# Patient Record
Sex: Female | Born: 1985 | Race: Black or African American | Hispanic: No | Marital: Single | Smoking: Former smoker
Health system: Southern US, Community
[De-identification: ages and names within clinical notes are randomized; demographics above are authoritative.]

## PROBLEM LIST (undated history)

## (undated) ENCOUNTER — Inpatient Hospital Stay (HOSPITAL_COMMUNITY): Payer: Self-pay

## (undated) DIAGNOSIS — K589 Irritable bowel syndrome without diarrhea: Secondary | ICD-10-CM

## (undated) DIAGNOSIS — F319 Bipolar disorder, unspecified: Secondary | ICD-10-CM

## (undated) DIAGNOSIS — S02402A Zygomatic fracture, unspecified, initial encounter for closed fracture: Secondary | ICD-10-CM

## (undated) DIAGNOSIS — G2581 Restless legs syndrome: Secondary | ICD-10-CM

## (undated) DIAGNOSIS — F32A Depression, unspecified: Secondary | ICD-10-CM

## (undated) DIAGNOSIS — F419 Anxiety disorder, unspecified: Secondary | ICD-10-CM

## (undated) DIAGNOSIS — E119 Type 2 diabetes mellitus without complications: Secondary | ICD-10-CM

## (undated) DIAGNOSIS — D219 Benign neoplasm of connective and other soft tissue, unspecified: Secondary | ICD-10-CM

## (undated) DIAGNOSIS — F329 Major depressive disorder, single episode, unspecified: Secondary | ICD-10-CM

## (undated) DIAGNOSIS — O009 Unspecified ectopic pregnancy without intrauterine pregnancy: Secondary | ICD-10-CM

## (undated) HISTORY — PX: WISDOM TOOTH EXTRACTION: SHX21

## (undated) HISTORY — DX: Restless legs syndrome: G25.81

## (undated) HISTORY — DX: Zygomatic fracture, unspecified side, initial encounter for closed fracture: S02.402A

## (undated) HISTORY — DX: Benign neoplasm of connective and other soft tissue, unspecified: D21.9

---
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
## 2005-01-01 ENCOUNTER — Emergency Department (HOSPITAL_COMMUNITY): Admission: EM | Admit: 2005-01-01 | Discharge: 2005-01-01 | Payer: Self-pay | Admitting: Emergency Medicine

## 2005-02-05 ENCOUNTER — Other Ambulatory Visit: Admission: RE | Admit: 2005-02-05 | Discharge: 2005-02-05 | Payer: Self-pay | Admitting: Obstetrics and Gynecology

## 2005-09-14 ENCOUNTER — Emergency Department (HOSPITAL_COMMUNITY): Admission: EM | Admit: 2005-09-14 | Discharge: 2005-09-14 | Payer: Self-pay | Admitting: Family Medicine

## 2006-05-03 ENCOUNTER — Emergency Department (HOSPITAL_COMMUNITY): Admission: EM | Admit: 2006-05-03 | Discharge: 2006-05-03 | Payer: Self-pay | Admitting: Emergency Medicine

## 2006-12-23 ENCOUNTER — Emergency Department (HOSPITAL_COMMUNITY): Admission: EM | Admit: 2006-12-23 | Discharge: 2006-12-23 | Payer: Self-pay | Admitting: Emergency Medicine

## 2007-03-04 DIAGNOSIS — K589 Irritable bowel syndrome without diarrhea: Secondary | ICD-10-CM

## 2007-03-04 HISTORY — DX: Irritable bowel syndrome, unspecified: K58.9

## 2007-04-09 ENCOUNTER — Emergency Department (HOSPITAL_COMMUNITY): Admission: EM | Admit: 2007-04-09 | Discharge: 2007-04-09 | Payer: Self-pay | Admitting: Emergency Medicine

## 2007-07-16 ENCOUNTER — Emergency Department (HOSPITAL_COMMUNITY): Admission: EM | Admit: 2007-07-16 | Discharge: 2007-07-16 | Payer: Self-pay | Admitting: Emergency Medicine

## 2007-07-20 ENCOUNTER — Emergency Department (HOSPITAL_COMMUNITY): Admission: EM | Admit: 2007-07-20 | Discharge: 2007-07-20 | Payer: Self-pay | Admitting: Emergency Medicine

## 2008-01-10 ENCOUNTER — Inpatient Hospital Stay (HOSPITAL_COMMUNITY): Admission: AD | Admit: 2008-01-10 | Discharge: 2008-01-10 | Payer: Self-pay | Admitting: Obstetrics & Gynecology

## 2008-05-01 ENCOUNTER — Emergency Department (HOSPITAL_COMMUNITY): Admission: EM | Admit: 2008-05-01 | Discharge: 2008-05-01 | Payer: Self-pay | Admitting: Emergency Medicine

## 2008-05-17 ENCOUNTER — Ambulatory Visit: Payer: Self-pay | Admitting: Obstetrics & Gynecology

## 2008-05-17 ENCOUNTER — Encounter: Payer: Self-pay | Admitting: Family

## 2008-07-09 ENCOUNTER — Inpatient Hospital Stay (HOSPITAL_COMMUNITY): Admission: AD | Admit: 2008-07-09 | Discharge: 2008-07-09 | Payer: Self-pay | Admitting: Obstetrics & Gynecology

## 2008-07-11 ENCOUNTER — Inpatient Hospital Stay (HOSPITAL_COMMUNITY): Admission: AD | Admit: 2008-07-11 | Discharge: 2008-07-11 | Payer: Self-pay | Admitting: Family Medicine

## 2008-08-17 ENCOUNTER — Emergency Department (HOSPITAL_COMMUNITY): Admission: EM | Admit: 2008-08-17 | Discharge: 2008-08-18 | Payer: Self-pay | Admitting: Emergency Medicine

## 2008-08-31 ENCOUNTER — Inpatient Hospital Stay (HOSPITAL_COMMUNITY): Admission: AD | Admit: 2008-08-31 | Discharge: 2008-08-31 | Payer: Self-pay | Admitting: Obstetrics & Gynecology

## 2010-03-12 ENCOUNTER — Inpatient Hospital Stay (HOSPITAL_COMMUNITY)
Admission: AD | Admit: 2010-03-12 | Discharge: 2010-03-12 | Disposition: A | Discharge status: Still In Hospital | Payer: Self-pay | Source: Home / Self Care | Attending: Obstetrics and Gynecology | Admitting: Obstetrics and Gynecology

## 2010-03-14 ENCOUNTER — Inpatient Hospital Stay (HOSPITAL_COMMUNITY)
Admission: AD | Admit: 2010-03-14 | Discharge: 2010-03-14 | Payer: Self-pay | Source: Home / Self Care | Attending: Obstetrics and Gynecology | Admitting: Obstetrics and Gynecology

## 2010-03-17 ENCOUNTER — Inpatient Hospital Stay (HOSPITAL_COMMUNITY)
Admission: AD | Admit: 2010-03-17 | Discharge: 2010-03-17 | Payer: Self-pay | Source: Home / Self Care | Attending: Family Medicine | Admitting: Family Medicine

## 2010-03-18 LAB — CBC
HCT: 37.4 % (ref 36.0–46.0)
HCT: 34.9 % — ABNORMAL LOW (ref 36.0–46.0)
Hemoglobin: 12.6 g/dL (ref 12.0–15.0)
Hemoglobin: 11.8 g/dL — ABNORMAL LOW (ref 12.0–15.0)
MCH: 27.8 pg (ref 26.0–34.0)
MCH: 27.9 pg (ref 26.0–34.0)
MCHC: 33.7 g/dL (ref 30.0–36.0)
MCHC: 33.8 g/dL (ref 30.0–36.0)
MCV: 82.4 fL (ref 78.0–100.0)
MCV: 82.5 fL (ref 78.0–100.0)
Platelets: 301 10*3/uL (ref 150–400)
Platelets: 272 10*3/uL (ref 150–400)
RBC: 4.54 MIL/uL (ref 3.87–5.11)
RBC: 4.23 MIL/uL (ref 3.87–5.11)
RDW: 13.8 % (ref 11.5–15.5)
RDW: 13.9 % (ref 11.5–15.5)
WBC: 6.2 10*3/uL (ref 4.0–10.5)
WBC: 4.7 10*3/uL (ref 4.0–10.5)

## 2010-03-18 LAB — URINALYSIS, ROUTINE W REFLEX MICROSCOPIC
Bilirubin Urine: NEGATIVE
Bilirubin Urine: NEGATIVE
Hgb urine dipstick: NEGATIVE
Hgb urine dipstick: NEGATIVE
Ketones, ur: NEGATIVE mg/dL
Ketones, ur: NEGATIVE mg/dL
Nitrite: NEGATIVE
Nitrite: NEGATIVE
Protein, ur: NEGATIVE mg/dL
Protein, ur: NEGATIVE mg/dL
Specific Gravity, Urine: 1.02 (ref 1.005–1.030)
Specific Gravity, Urine: 1.02 (ref 1.005–1.030)
Urine Glucose, Fasting: NEGATIVE mg/dL
Urine Glucose, Fasting: NEGATIVE mg/dL
Urobilinogen, UA: 0.2 mg/dL (ref 0.0–1.0)
Urobilinogen, UA: 0.2 mg/dL (ref 0.0–1.0)
pH: 7 (ref 5.0–8.0)
pH: 6.5 (ref 5.0–8.0)

## 2010-03-18 LAB — GC/CHLAMYDIA PROBE AMP, GENITAL
Chlamydia, DNA Probe: NEGATIVE
GC Probe Amp, Genital: NEGATIVE

## 2010-03-18 LAB — HCG, QUANTITATIVE, PREGNANCY
hCG, Beta Chain, Quant, S: 174 m[IU]/mL — ABNORMAL HIGH (ref <5)
hCG, Beta Chain, Quant, S: 601 m[IU]/mL — ABNORMAL HIGH (ref <5)
hCG, Beta Chain, Quant, S: 2042 m[IU]/mL — ABNORMAL HIGH (ref <5)

## 2010-03-18 LAB — WET PREP, GENITAL
Clue Cells Wet Prep HPF POC: NONE SEEN
Trich, Wet Prep: NONE SEEN
Yeast Wet Prep HPF POC: NONE SEEN

## 2010-03-18 LAB — ABO/RH
ABO/RH(D): O NEG
ABO/RH(D): O NEG

## 2010-03-18 LAB — POCT PREGNANCY, URINE: Preg Test, Ur: POSITIVE

## 2010-03-21 ENCOUNTER — Ambulatory Visit (HOSPITAL_COMMUNITY)
Admission: RE | Admit: 2010-03-21 | Discharge: 2010-03-21 | Payer: Self-pay | Source: Home / Self Care | Attending: Obstetrics & Gynecology | Admitting: Obstetrics & Gynecology

## 2010-04-03 ENCOUNTER — Encounter: Payer: Self-pay | Admitting: Obstetrics and Gynecology

## 2010-04-11 ENCOUNTER — Inpatient Hospital Stay (HOSPITAL_COMMUNITY): Payer: Self-pay

## 2010-04-11 ENCOUNTER — Inpatient Hospital Stay (HOSPITAL_COMMUNITY)
Admission: AD | Admit: 2010-04-11 | Discharge: 2010-04-11 | Disposition: A | Discharge status: Home / Self Care | Payer: Self-pay | Source: Ambulatory Visit | Attending: Obstetrics & Gynecology | Admitting: Obstetrics & Gynecology

## 2010-04-11 DIAGNOSIS — IMO0002 Reserved for concepts with insufficient information to code with codable children: Secondary | ICD-10-CM

## 2010-04-11 DIAGNOSIS — O021 Missed abortion: Secondary | ICD-10-CM | POA: Insufficient documentation

## 2010-04-11 LAB — URINE MICROSCOPIC-ADD ON

## 2010-04-11 LAB — URINALYSIS, ROUTINE W REFLEX MICROSCOPIC
Bilirubin Urine: NEGATIVE
Nitrite: NEGATIVE
Specific Gravity, Urine: 1.02 (ref 1.005–1.030)
Urobilinogen, UA: 1 mg/dL (ref 0.0–1.0)
pH: 6.5 (ref 5.0–8.0)

## 2010-04-11 LAB — CBC
MCH: 27.7 pg (ref 26.0–34.0)
Platelets: 304 10*3/uL (ref 150–400)
RBC: 4.3 MIL/uL (ref 3.87–5.11)
RDW: 13.7 % (ref 11.5–15.5)
WBC: 4.2 10*3/uL (ref 4.0–10.5)

## 2010-04-11 LAB — HCG, QUANTITATIVE, PREGNANCY: hCG, Beta Chain, Quant, S: 47780 m[IU]/mL — ABNORMAL HIGH (ref <5)

## 2010-04-12 LAB — URINE CULTURE

## 2010-04-12 LAB — RH IMMUNE GLOBULIN WORKUP (NOT WOMEN'S HOSP): ABO/RH(D): O NEG

## 2010-04-25 ENCOUNTER — Encounter: Payer: Self-pay | Admitting: Physician Assistant

## 2010-06-09 LAB — GC/CHLAMYDIA PROBE AMP, GENITAL
Chlamydia, DNA Probe: NEGATIVE
GC Probe Amp, Genital: NEGATIVE

## 2010-06-09 LAB — CBC
HCT: 37.2 % (ref 36.0–46.0)
Hemoglobin: 12.8 g/dL (ref 12.0–15.0)
MCHC: 34.3 g/dL (ref 30.0–36.0)
MCV: 86.8 fL (ref 78.0–100.0)
Platelets: 257 10*3/uL (ref 150–400)
RDW: 13.3 % (ref 11.5–15.5)

## 2010-06-09 LAB — WET PREP, GENITAL
Trich, Wet Prep: NONE SEEN
Yeast Wet Prep HPF POC: NONE SEEN

## 2010-06-09 LAB — POCT PREGNANCY, URINE: Preg Test, Ur: NEGATIVE

## 2010-06-10 LAB — URINALYSIS, ROUTINE W REFLEX MICROSCOPIC
Bilirubin Urine: NEGATIVE
Nitrite: NEGATIVE
Specific Gravity, Urine: 1.026 (ref 1.005–1.030)
Urobilinogen, UA: 0.2 mg/dL (ref 0.0–1.0)

## 2010-06-10 LAB — GC/CHLAMYDIA PROBE AMP, GENITAL
Chlamydia, DNA Probe: NEGATIVE
GC Probe Amp, Genital: NEGATIVE

## 2010-06-10 LAB — WET PREP, GENITAL
Trich, Wet Prep: NONE SEEN
Yeast Wet Prep HPF POC: NONE SEEN

## 2010-06-10 LAB — URINE MICROSCOPIC-ADD ON

## 2010-06-10 LAB — POCT PREGNANCY, URINE: Preg Test, Ur: NEGATIVE

## 2010-06-11 LAB — CBC
HCT: 35.5 % — ABNORMAL LOW (ref 36.0–46.0)
MCHC: 34.3 g/dL (ref 30.0–36.0)
MCV: 87.4 fL (ref 78.0–100.0)
RBC: 4.06 MIL/uL (ref 3.87–5.11)
WBC: 4.3 10*3/uL (ref 4.0–10.5)

## 2010-06-11 LAB — WET PREP, GENITAL: Clue Cells Wet Prep HPF POC: NONE SEEN

## 2010-06-13 LAB — WET PREP, GENITAL
Trich, Wet Prep: NONE SEEN
Yeast Wet Prep HPF POC: NONE SEEN

## 2010-06-13 LAB — POCT URINALYSIS DIP (DEVICE)
Bilirubin Urine: NEGATIVE
Glucose, UA: NEGATIVE mg/dL
Ketones, ur: NEGATIVE mg/dL
Specific Gravity, Urine: 1.02 (ref 1.005–1.030)

## 2010-07-16 NOTE — Group Therapy Note (Signed)
Brandi Lucero, Brandi Lucero                 ACCOUNT NO.:  [id number]   MEDICAL RECORD NO.:  [id number]          PATIENT TYPE:  WOC   LOCATION:  [facility]                   FACILITY:  [facility]   PHYSICIAN:  Sid Falcon, CNM  DATE OF BIRTH:  Oct 18, 1985   DATE OF SERVICE:  05/17/2008                                  CLINIC NOTE   The patient is here for a well-woman exam.  Seen at [facility] on March 1, diagnosed with bacterial vaginosis and treated with  Flagyl.  The patient reports that symptoms have resolved and is here  because she has not had a Pap smear.  The patient denies any other  changes in her medical history and denies any changes in her family  history.   SOCIAL HISTORY:  The patient is in a relationship.  Boyfriend has been  in [place] times one, involved in a monogamous relationship.  Does not  desire birth control method at this time.   PHYSICAL EXAMINATION:  GENERAL:  The patient is alert and oriented x3.  No signs of acute distress.  NECK:  No thyromegaly.  No dominant masses.  Nontender to  palpation.  CHEST:  Breasts soft.  Bilateral nipple rings.  No dominant masses.  No  nipple discharge.  No retractions bilaterally.  CARDIOVASCULAR:  Regular rate and rhythm without murmurs, gallops or  rubs.  LUNGS:  Clear to auscultation bilaterally.  ABDOMEN:  Positive bowel sounds x4.  No hepatosplenomegaly.  PELVIC:  Vagina:  No redness, no abnormal discharge, no abnormal  lesions.  Cervix:  No abnormal discharge.  Negative cervical motion  tenderness.  Uterus:  Midline, mobile, nontender with palpation.  No  dominant masses.  Adnexa nontender with palpation.  No dominant masses.  EXTREMITIES:  Without edema, 2+ bilateral patellar DTRs.   ASSESSMENT:  Well-woman exam.   PLAN:  Pap smear sent to lab.  Advised condom use.  Reviewed the reason  for a Pap smear.  The patient will follow up as needed.      Sid Falcon, CNM     WM/MEDQ  D:  05/17/2008  T:   05/18/2008  Job:  [id number]

## 2010-07-19 NOTE — Group Therapy Note (Signed)
NAMELIANAH, PEED NO.:  [id number]   MEDICAL RECORD NO.:  [id number]          PATIENT TYPE:  WOC   LOCATION:  [facility]                   FACILITY:  [facility]   PHYSICIAN:  Elsie Lincoln, MD      DATE OF BIRTH:  09/18/1985   DATE OF SERVICE:  06/25/2004                                    CLINIC NOTE   The patient is an 25 year old female who underwent laparoscopic RSO for a  leaking ectopic on May 30, 2004.  The patient had an uncomplicated  laparoscopy and recovery.  The patient has no complaints since the surgery.  I inquired about whether she wanted birth control at this time and she does.  However, she states that she is a patient of [facility] and has an  appointment there next week.  She wants to followup with them.  I also note  that patient was a smoker and encouraged her to quit, the patient verbalizes  that she knows it is bad for her.   ASSESSMENT AND PLAN:  An 25 year old female well healed after laparoscopic  RSO for ruptured ectopic.  Return to [facility] for Pap smears and  starting of oral contraceptives.      KL/MEDQ  D:  06/25/2004  T:  06/25/2004  Job:  [id number]

## 2010-07-19 NOTE — Op Note (Signed)
Brandi Lucero, Brandi Lucero                 ACCOUNT NO.:  [id number]   MEDICAL RECORD NO.:  [id number]          PATIENT TYPE:  AMB   LOCATION:  910B                          [facility]   PHYSICIAN:  Lesly Dukes, M.D. DATE OF BIRTH:  1985/04/16   DATE OF PROCEDURE:  05/30/2004  DATE OF DISCHARGE:  05/30/2004                                 OPERATIVE REPORT   PREOPERATIVE DIAGNOSIS:  The patient is an 25 year old female with leaking  right ectopic.   POSTOPERATIVE DIAGNOSIS:  The patient is an 25 year old female with leaking  right ectopic.   PROCEDURE:  Diagnostic laparoscopy and right partial salpingectomy.   SURGEON:  Lesly Dukes, M.D.   ASSISTANT:  Shelbie Proctor. Shawnie Pons, M.D.   ESTIMATED BLOOD LOSS:  Minimal.   ANESTHESIA:  General.   COMPLICATIONS:  None.   PATHOLOGY:  Right fallopian tube and ectopic pregnancy.   FINDINGS:  Markedly dilated right fallopian tube that was leaking from the  ectopic pregnancy.  Perfectly normal pelvis otherwise.  Left ovary and tube  normal, right ovary normal grossly.   PROCEDURE:  After informed consent was obtained, the patient was taken to  the operating room, where general anesthesia was induced.  The patient was  prepared and draped in normal sterile fashion.  A Hulka clip was placed into  the uterus to aid in mobilization.  Gown and gloves were changed.  The  bladder was emptied by the nurse.  An infraumbilical skin incision was made  with the scalpel and carried down bluntly to the fascia.  The fascia was  incised in the midline.  This incision was extended superiorly and  inferiorly bluntly.  The peritoneum was entered bluntly and the Hasson  trocar was placed into the abdomen.  Sutures of 0 Vicryl were used to tag  the fascia and anchor the Hasson trocar.  The pneumoperitoneum was achieved  with CO2, the laparoscope __________ the uterus and the above findings were  found.  Two pelvic ports were placed under direct visualization.   There was  a 10 mm on the right and a 5 mm on the left.  In each case, the skin was  incised with a scalpel and a trocar was introduced under direct  visualization.  The tube was elevated and the tripolar was used to excise a  portion of the tube and the ectopic pregnancy.  An Endopouch was introduced  into the abdomen and the ectopic was removed without complications.  The  area was irrigated and found to be hemostatic.  Pneumoperitoneum was  released and the laparoscope and Hasson trocar were brought out together.  The fascia at the umbilicus was closed with 0 Vicryl and the right 10 mm  port fascial incision was closed with 0 Vicryl.  The skin was closed with 4-  0 Vicryl in subcuticular fashion in each  port.  The Hulka clip was removed from the cervix and the cervix was noted  to be hemostatic.  The patient tolerated the procedure well.  Sponge, lap,  instrument and needle count were correct x2.  The patient went to the  recovery room in stable condition.      KHL/MEDQ  D:  07/11/2004  T:  07/11/2004  Job:  [id number]

## 2010-11-27 LAB — RAPID STREP SCREEN (MED CTR MEBANE ONLY): Streptococcus, Group A Screen (Direct): NEGATIVE

## 2010-12-03 LAB — WET PREP, GENITAL

## 2010-12-11 LAB — URINALYSIS, ROUTINE W REFLEX MICROSCOPIC
Glucose, UA: NEGATIVE
Protein, ur: NEGATIVE
Specific Gravity, Urine: 1.023

## 2010-12-11 LAB — WET PREP, GENITAL
Clue Cells Wet Prep HPF POC: NONE SEEN
Trich, Wet Prep: NONE SEEN
Yeast Wet Prep HPF POC: NONE SEEN

## 2010-12-11 LAB — PREGNANCY, URINE: Preg Test, Ur: NEGATIVE

## 2011-05-30 ENCOUNTER — Encounter (HOSPITAL_COMMUNITY): Payer: Self-pay

## 2011-05-30 ENCOUNTER — Emergency Department (INDEPENDENT_AMBULATORY_CARE_PROVIDER_SITE_OTHER)
Admission: EM | Admit: 2011-05-30 | Discharge: 2011-05-30 | Disposition: A | Discharge status: Home / Self Care | Payer: Self-pay | Source: Home / Self Care | Attending: Emergency Medicine | Admitting: Emergency Medicine

## 2011-05-30 DIAGNOSIS — K529 Noninfective gastroenteritis and colitis, unspecified: Secondary | ICD-10-CM

## 2011-05-30 DIAGNOSIS — K5289 Other specified noninfective gastroenteritis and colitis: Secondary | ICD-10-CM

## 2011-05-30 DIAGNOSIS — R1110 Vomiting, unspecified: Secondary | ICD-10-CM

## 2011-05-30 HISTORY — DX: Irritable bowel syndrome without diarrhea: K58.9

## 2011-05-30 LAB — POCT I-STAT, CHEM 8
BUN: 15 mg/dL (ref 6–23)
Creatinine, Ser: 0.8 mg/dL (ref 0.50–1.10)
Potassium: 3.8 mEq/L (ref 3.5–5.1)
Sodium: 138 mEq/L (ref 135–145)
TCO2: 22 mmol/L (ref 0–100)

## 2011-05-30 MED ORDER — DIPHENOXYLATE-ATROPINE 2.5-0.025 MG PO TABS: 1.0000 | ORAL_TABLET | Freq: Four times a day (QID) | ORAL | Status: DC | PRN

## 2011-05-30 MED ORDER — ONDANSETRON 4 MG PO TBDP
8.0000 mg | ORAL_TABLET | Freq: Once | ORAL | Status: AC
  Administered 2011-05-30: 8 mg via ORAL

## 2011-05-30 MED ORDER — ONDANSETRON HCL 4 MG PO TABS: 4.0000 mg | ORAL_TABLET | Freq: Three times a day (TID) | ORAL | Status: DC | PRN

## 2011-05-30 MED ORDER — ONDANSETRON 4 MG PO TBDP
ORAL_TABLET | ORAL | Status: AC
  Filled 2011-05-30: qty 2

## 2011-05-30 NOTE — ED Provider Notes (Signed)
History     CSN: [id number]  Arrival date & time 05/30/11  5621   First MD Initiated Contact with Patient 05/30/11 1109      Chief Complaint  Patient presents with  . Abdominal Pain    (Consider location/radiation/quality/duration/timing/severity/associated sxs/prior treatment) HPI Comments: Patient presents to urgent care, abdominal cramps and ongoing vomiting since this morning with 10-12 episodes of diarrhea. Been feeling tired fatigued and dizzy this morning try to take 2 tablets of Pepto-Bismol have not helped her.  She could have had a fever she felt warm and was sweating. No respiratory symptoms  Patient is a 26 y.o. female presenting with abdominal pain. The history is provided by the patient.  Abdominal Pain The primary symptoms of the illness include abdominal pain, fatigue, nausea, vomiting and diarrhea. The primary symptoms of the illness do not include fever. The current episode started 6 to 12 hours ago. The onset of the illness was sudden. The problem has not changed since onset. Additional symptoms associated with the illness include chills. Symptoms associated with the illness do not include constipation, urgency or frequency.    Past Medical History  Diagnosis Date  . IBS (irritable bowel syndrome) 2009    History reviewed. No pertinent past surgical history.  History reviewed. No pertinent family history.  History  Substance Use Topics  . Smoking status: Current Everyday Smoker -- 0.5 packs/day    Types: Cigarettes  . Smokeless tobacco: Not on file  . Alcohol Use: Yes     occasional    OB History    Grav Para Term Preterm Abortions TAB SAB Ect Mult Living                  Review of Systems  Constitutional: Positive for chills, activity change and fatigue. Negative for fever.  HENT: Negative for facial swelling.   Eyes: Negative for redness.  Cardiovascular: Negative for chest pain.  Gastrointestinal: Positive for nausea, vomiting, abdominal  pain and diarrhea. Negative for constipation.  Genitourinary: Negative for urgency and frequency.    Allergies  Review of patient's allergies indicates no known allergies.  Home Medications   Current Outpatient Rx  Name Route Sig Dispense Refill  . DIPHENOXYLATE-ATROPINE 2.5-0.025 MG PO TABS Oral Take 1 tablet by mouth 4 (four) times daily as needed for diarrhea or loose stools. 30 tablet 0  . ONDANSETRON HCL 4 MG PO TABS Oral Take 1 tablet (4 mg total) by mouth every 8 (eight) hours as needed for nausea. 20 tablet 0    BP 124/76  Pulse 110  Temp(Src) 97 F (36.1 C) (Oral)  Resp 18  Ht 5\' 1"  (1.549 m)  Wt 135 lb (61.236 kg)  BMI 25.51 kg/m2  SpO2 97%  LMP 05/20/2011  Physical Exam  HENT:  Head: Normocephalic.  Eyes: Conjunctivae are normal. No scleral icterus.  Neck: Neck supple.  Pulmonary/Chest: No respiratory distress.  Abdominal: Soft. She exhibits no mass. There is no hepatosplenomegaly, splenomegaly or hepatomegaly. There is tenderness in the epigastric area and periumbilical area. There is no rigidity, no rebound, no guarding, no CVA tenderness, no tenderness at McBurney's point and negative Murphy's sign. No hernia.  Skin: No rash noted. She is not diaphoretic.    ED Course  Procedures (including critical care time)  Labs Reviewed  POCT I-STAT, CHEM 8 - Abnormal; Notable for the following:    Glucose, Bld 117 (*)    Calcium, Ion 1.09 (*)    All other components within normal limits  No results found.   1. Gastroenteritis   2. Vomiting       MDM  Gastroenteritis, tolerating oral fluids at [facility] no focal abdominal pain-        Jimmie Molly, MD 05/30/11 2043

## 2011-05-30 NOTE — ED Notes (Signed)
Pt in clinic today c/o stomach ache, diarrhea and vomiting that started early this morning. Denies any blood in diarrhea or vomit. Also c/o of dizziness and weakness. Treated symptoms with pepto bismol 2 tablets with no relief.

## 2011-05-30 NOTE — Discharge Instructions (Signed)
S. discuss your current exam and symptoms were consistent with a viral gastroenteritis use his prescribed medicines and B. precautions about your diet in the next 48 hours C. further information below on your discharge instructions for further symptom management. If unable to tolerate fluids the next 8-12 hours should go to the emergency department for further management and treatment.    Diet for Diarrhea, Adult Having frequent, runny stools (diarrhea) has many causes. Diarrhea may be caused or worsened by food or drink. Diarrhea may be relieved by changing your diet. IF YOU ARE NOT TOLERATING SOLID FOODS:  Drink enough water and fluids to keep your urine clear or pale yellow.   Avoid sugary drinks and sodas as well as milk-based beverages.   Avoid beverages containing caffeine and alcohol.   You may try rehydrating beverages. You can make your own by following this recipe:    tsp table salt.    tsp baking soda.   ? tsp salt substitute (potassium chloride).   1 tbs + 1 tsp sugar.   1 qt water.  As your stools become more solid, you can start eating solid foods. Add foods one at a time. If a certain food causes your diarrhea to get worse, avoid that food and try other foods. A low fiber, low-fat, and lactose-free diet is recommended. Small, frequent meals may be better tolerated.  Starches  Allowed:  White, [place], and pita breads, plain rolls, buns, bagels. Plain muffins, matzo. Soda, saltine, or graham crackers. Pretzels, melba toast, zwieback. Cooked cereals made with water: cornmeal, farina, cream cereals. Dry cereals: refined corn, wheat, rice. Potatoes prepared any way without skins, refined macaroni, spaghetti, noodles, refined rice.   Avoid:  Bread, rolls, or crackers made with whole wheat, multi-grains, rye, bran seeds, nuts, or coconut. Corn tortillas or taco shells. Cereals containing whole grains, multi-grains, bran, coconut, nuts, or raisins. Cooked or dry oatmeal. Coarse  wheat cereals, granola. Cereals advertised as "high-fiber." Potato skins. Whole grain pasta, wild or brown rice. Popcorn. Sweet potatoes/yams. Sweet rolls, doughnuts, waffles, pancakes, sweet breads.  Vegetables  Allowed: Strained tomato and vegetable juices. Most well-cooked and canned vegetables without seeds. Fresh: Tender lettuce, cucumber without the skin, cabbage, spinach, bean sprouts.   Avoid: Fresh, cooked, or canned: Artichokes, baked beans, beet greens, broccoli, Brussels sprouts, corn, kale, legumes, peas, sweet potatoes. Cooked: Green or red cabbage, spinach. Avoid large servings of any vegetables, because vegetables shrink when cooked, and they contain more fiber per serving than fresh vegetables.  Fruit  Allowed: All fruit juices except prune juice. Cooked or canned: Apricots, applesauce, cantaloupe, cherries, fruit cocktail, grapefruit, grapes, kiwi, mandarin oranges, peaches, pears, plums, watermelon. Fresh: Apples without skin, ripe banana, grapes, cantaloupe, cherries, grapefruit, peaches, oranges, plums. Keep servings limited to  cup or 1 piece.   Avoid: Fresh: Apple with skin, apricots, mango, pears, raspberries, strawberries. Prune juice, stewed or dried prunes. Dried fruits, raisins, dates. Large servings of all fresh fruits.  Meat and Meat Substitutes  Allowed: Ground or well-cooked tender beef, ham, veal, lamb, pork, or poultry. Eggs, plain cheese. Fish, oysters, shrimp, lobster, other seafoods. Liver, organ meats.   Avoid: Tough, fibrous meats with gristle. Peanut butter, smooth or chunky. Cheese, nuts, seeds, legumes, dried peas, beans, lentils.  Milk  Allowed: Yogurt, lactose-free milk, kefir, drinkable yogurt, buttermilk, soy milk.   Avoid: Milk, chocolate milk, beverages made with milk, such as milk shakes.  Soups  Allowed: Bouillon, broth, or soups made from allowed foods. Any strained soup.  Avoid: Soups made from vegetables that are not allowed, cream or  milk-based soups.  Desserts and Sweets  Allowed: Sugar-free gelatin, sugar-free frozen ice pops made without sugar alcohol.   Avoid: Plain cakes and cookies, pie made with allowed fruit, pudding, custard, cream pie. Gelatin, fruit, ice, sherbet, frozen ice pops. Ice cream, ice milk without nuts. Plain hard candy, honey, jelly, molasses, syrup, sugar, chocolate syrup, gumdrops, marshmallows.  Fats and Oils  Allowed: Avoid any fats and oils.   Avoid: Seeds, nuts, olives, avocados. Margarine, butter, cream, mayonnaise, salad oils, plain salad dressings made from allowed foods. Plain gravy, crisp bacon without rind.  Beverages  Allowed: Water, decaffeinated teas, oral rehydration solutions, sugar-free beverages.   Avoid: Fruit juices, caffeinated beverages (coffee, tea, soda or pop), alcohol, sports drinks, or lemon-lime soda or pop.  Condiments  Allowed: Ketchup, mustard, horseradish, vinegar, cream sauce, cheese sauce, cocoa powder. Spices in moderation: allspice, basil, bay leaves, celery powder or leaves, cinnamon, cumin powder, curry powder, ginger, mace, marjoram, onion or garlic powder, oregano, paprika, parsley flakes, ground pepper, rosemary, sage, savory, tarragon, thyme, turmeric.   Avoid: Coconut, honey.  Weight Monitoring: Weigh yourself every day. You should weigh yourself in the morning after you urinate and before you eat breakfast. Wear the same amount of clothing when you weigh yourself. Record your weight daily. Bring your recorded weights to your clinic visits. Tell your caregiver right away if you have gained 3 lb/1.4 kg or more in 1 day, 5 lb/2.3 kg in a week, or whatever amount you were told to report. SEEK IMMEDIATE MEDICAL CARE IF:   You are unable to keep fluids down.   You start to throw up (vomit) or diarrhea keeps coming back (persistent).   Abdominal pain develops, increases, or can be felt in one place (localizes).   You have an oral temperature above 102 F  (38.9 C), not controlled by medicine.   Diarrhea contains blood or mucus.   You develop excessive weakness, dizziness, fainting, or extreme thirst.  MAKE SURE YOU:   Understand these instructions.   Will watch your condition.   Will get help right away if you are not doing well or get worse.  Document Released: 05/10/2003 Document Revised: 02/06/2011 Document Reviewed: 08/31/2008 Exit[employer] Patient Information 2012 ExitCare, [place] and Vomiting Nausea is a sick feeling that often comes before throwing up (vomiting). Vomiting is a reflex where stomach contents come out of your mouth. Vomiting can cause severe loss of body fluids (dehydration). Children and elderly adults can become dehydrated quickly, especially if they also have diarrhea. Nausea and vomiting are symptoms of a condition or disease. It is important to find the cause of your symptoms. CAUSES   Direct irritation of the stomach lining. This irritation can result from increased acid production (gastroesophageal reflux disease), infection, food poisoning, taking certain medicines (such as nonsteroidal anti-inflammatory drugs), alcohol use, or tobacco use.   Signals from the brain.These signals could be caused by a headache, heat exposure, an inner ear disturbance, increased pressure in the brain from injury, infection, a tumor, or a concussion, pain, emotional stimulus, or metabolic problems.   An obstruction in the gastrointestinal tract (bowel obstruction).   Illnesses such as diabetes, hepatitis, gallbladder problems, appendicitis, kidney problems, cancer, sepsis, atypical symptoms of a heart attack, or eating disorders.   Medical treatments such as chemotherapy and radiation.   Receiving medicine that makes you sleep (general anesthetic) during surgery.  DIAGNOSIS Your caregiver may ask for tests to be  done if the problems do not improve after a few days. Tests may also be done if symptoms are severe or if the reason  for the nausea and vomiting is not clear. Tests may include:  Urine tests.   Blood tests.   Stool tests.   Cultures (to look for evidence of infection).   X-rays or other imaging studies.  Test results can help your caregiver make decisions about treatment or the need for additional tests. TREATMENT You need to stay well hydrated. Drink frequently but in small amounts.You may wish to drink water, sports drinks, clear broth, or eat frozen ice pops or gelatin dessert to help stay hydrated.When you eat, eating slowly may help prevent nausea.There are also some antinausea medicines that may help prevent nausea. HOME CARE INSTRUCTIONS   Take all medicine as directed by your caregiver.   If you do not have an appetite, do not force yourself to eat. However, you must continue to drink fluids.   If you have an appetite, eat a normal diet unless your caregiver tells you differently.   Eat a variety of complex carbohydrates (rice, wheat, potatoes, bread), lean meats, yogurt, fruits, and vegetables.   Avoid high-fat foods because they are more difficult to digest.   Drink enough water and fluids to keep your urine clear or pale yellow.   If you are dehydrated, ask your caregiver for specific rehydration instructions. Signs of dehydration may include:   Severe thirst.   Dry lips and mouth.   Dizziness.   Dark urine.   Decreasing urine frequency and amount.   Confusion.   Rapid breathing or pulse.  SEEK IMMEDIATE MEDICAL CARE IF:   You have blood or brown flecks (like coffee grounds) in your vomit.   You have black or bloody stools.   You have a severe headache or stiff neck.   You are confused.   You have severe abdominal pain.   You have chest pain or trouble breathing.   You do not urinate at least once every 8 hours.   You develop cold or clammy skin.   You continue to vomit for longer than 24 to 48 hours.   You have a fever.  MAKE SURE YOU:   Understand  these instructions.   Will watch your condition.   Will get help right away if you are not doing well or get worse.  Document Released: 02/17/2005 Document Revised: 02/06/2011 Document Reviewed: 07/17/2010 [facility] Patient Information 2012 [place], [place].Marland Kitchen

## 2011-06-04 ENCOUNTER — Encounter (HOSPITAL_COMMUNITY): Payer: Self-pay | Admitting: Emergency Medicine

## 2011-06-04 ENCOUNTER — Emergency Department (HOSPITAL_COMMUNITY)
Admission: EM | Admit: 2011-06-04 | Discharge: 2011-06-04 | Disposition: A | Discharge status: Home / Self Care | Payer: Self-pay | Attending: Emergency Medicine | Admitting: Emergency Medicine

## 2011-06-04 DIAGNOSIS — R21 Rash and other nonspecific skin eruption: Secondary | ICD-10-CM | POA: Insufficient documentation

## 2011-06-04 DIAGNOSIS — F17200 Nicotine dependence, unspecified, uncomplicated: Secondary | ICD-10-CM | POA: Insufficient documentation

## 2011-06-04 DIAGNOSIS — F411 Generalized anxiety disorder: Secondary | ICD-10-CM | POA: Insufficient documentation

## 2011-06-04 DIAGNOSIS — K589 Irritable bowel syndrome without diarrhea: Secondary | ICD-10-CM | POA: Insufficient documentation

## 2011-06-04 NOTE — ED Notes (Signed)
PT. REPORTS RASH AT ARMS , ABDOMEN AND BACK FOR 1 YEAR , DENIES ITCHING , RESPIRATIONS UNLABORED.

## 2011-06-04 NOTE — Discharge Instructions (Signed)
Anxiety and Panic Attacks Anxiety is your body's way of reacting to real danger or something you think is a danger. It may be fear or worry over a situation like losing your job. Sometimes the cause is not known. A panic attack is made up of physical signs like sweating, shaking, or chest pain. Anxiety and panic attacks may start suddenly. They may be strong. They may come at any time of day, even while sleeping. They may come at any time of life. Panic attacks are scary, but they do not harm you physically.  HOME CARE  Avoid any known causes of your anxiety.   Try to relax. Yoga may help. Tell yourself everything will be okay.   Exercise often.   Get expert advice and help (therapy) to stop anxiety or attacks from happening.   Avoid caffeine, alcohol, and drugs.   Only take medicine as told by your doctor.  GET HELP RIGHT AWAY IF:  Your attacks seem different than normal attacks.   Your problems are getting worse or concern you.  MAKE SURE YOU:  Understand these instructions.   Will watch your condition.   Will get help right away if you are not doing well or get worse.  Document Released: 03/22/2010 Document Revised: 02/06/2011 Document Reviewed: 03/22/2010 [facility] Patient Information 2012 [place], [place]. Please call and make an appointment for dermatology for further evaluation of your skin discoloration

## 2011-06-04 NOTE — ED Provider Notes (Signed)
History     CSN: [id number]  Arrival date & time 06/04/11  2107   First MD Initiated Contact with Patient 06/04/11 2152      Chief Complaint  Patient presents with  . Rash    (Consider location/radiation/quality/duration/timing/severity/associated sxs/prior treatment) HPI Comments: Patient has discolored areas on her upper right and left arm and on her low left lateral chest wall.  There is slightly darker in pigmentation than her skin.  It is not raised.  It is not itchy.  It is not painful.  It is stable.  It has been there for over a year.  She has been trying numerous products to fade.  These areas over the last 6 months.  She is concerned that this is something very serious and she is going to die from this rash.  She is currently under the care of psychiatrist for severe anxiety disorder  Patient is a 26 y.o. female presenting with rash. The history is provided by the patient.  Rash  The problem has not changed since onset.The problem is associated with nothing. There has been no fever. The rash is present on the abdomen, right arm and left arm. The pain is at a severity of 0/10.    Past Medical History  Diagnosis Date  . IBS (irritable bowel syndrome) 2009    History reviewed. No pertinent past surgical history.  No family history on file.  History  Substance Use Topics  . Smoking status: Current Everyday Smoker -- 0.5 packs/day    Types: Cigarettes  . Smokeless tobacco: Not on file  . Alcohol Use: Yes     occasional    OB History    Grav Para Term Preterm Abortions TAB SAB Ect Mult Living                  Review of Systems  Constitutional: Negative for fever and chills.  HENT: Negative for congestion.   Respiratory: Negative for shortness of breath.   Cardiovascular: Negative for chest pain and palpitations.  Gastrointestinal: Negative for nausea.  Musculoskeletal: Negative for myalgias, joint swelling, arthralgias and gait problem.  Skin: Positive for  rash.  Neurological: Negative for dizziness and headaches.    Allergies  Review of patient's allergies indicates no known allergies.  Home Medications   Current Outpatient Rx  Name Route Sig Dispense Refill  . CITALOPRAM HYDROBROMIDE 20 MG PO TABS Oral Take 20 mg by mouth daily.    Marland Kitchen DIPHENOXYLATE-ATROPINE 2.5-0.025 MG PO TABS Oral Take 1 tablet by mouth 4 (four) times daily as needed. For diarrhea/loose stools    . ONDANSETRON HCL 4 MG PO TABS Oral Take 4 mg by mouth every 8 (eight) hours as needed. For nausea      BP 104/73  Pulse 117  Temp(Src) 99 F (37.2 C) (Oral)  Resp 20  SpO2 99%  LMP 05/20/2011  Physical Exam  Constitutional: She appears well-developed.  HENT:  Head: Normocephalic.  Cardiovascular: Regular rhythm.  Tachycardia present.   Pulmonary/Chest: Effort normal and breath sounds normal.  Musculoskeletal: Normal range of motion. She exhibits no edema and no tenderness.  Skin:       Several areas, less than 1 cm irregularly-shaped flat pigmentation is slightly darker than her normal.  Skin tone, not pleuritic.  Does not appear to be a mole, and melanoma.  Any type of skin cancer, skin irritation, abscess.  These areas have been present for over a year on her upper arms and left lateral rib  cage area    ED Course  Procedures (including critical care time)  Labs Reviewed - No data to display No results found.   1. Anxiety state, neurotic       MDM  Anxiety stable change in skin pigmentation, not indicative of a melanoma, skin cancer, infection        Arman Filter, NP 06/05/11 [phone]

## 2011-06-05 NOTE — ED Provider Notes (Signed)
Medical screening examination/treatment/procedure(s) were performed by non-physician practitioner and as supervising physician I was immediately available for consultation/collaboration.  Juliet Rude. Rubin Payor, MD 06/05/11 2352

## 2011-07-02 ENCOUNTER — Inpatient Hospital Stay (HOSPITAL_COMMUNITY)
Admission: AD | Admit: 2011-07-02 | Discharge: 2011-07-02 | Disposition: A | Discharge status: Home / Self Care | Payer: Self-pay | Source: Ambulatory Visit | Attending: Obstetrics and Gynecology | Admitting: Obstetrics and Gynecology

## 2011-07-02 ENCOUNTER — Encounter (HOSPITAL_COMMUNITY): Payer: Self-pay | Admitting: *Deleted

## 2011-07-02 DIAGNOSIS — N760 Acute vaginitis: Secondary | ICD-10-CM | POA: Insufficient documentation

## 2011-07-02 DIAGNOSIS — B9689 Other specified bacterial agents as the cause of diseases classified elsewhere: Secondary | ICD-10-CM | POA: Insufficient documentation

## 2011-07-02 DIAGNOSIS — N949 Unspecified condition associated with female genital organs and menstrual cycle: Secondary | ICD-10-CM | POA: Insufficient documentation

## 2011-07-02 DIAGNOSIS — A499 Bacterial infection, unspecified: Secondary | ICD-10-CM | POA: Insufficient documentation

## 2011-07-02 DIAGNOSIS — N938 Other specified abnormal uterine and vaginal bleeding: Secondary | ICD-10-CM | POA: Insufficient documentation

## 2011-07-02 HISTORY — DX: Unspecified ectopic pregnancy without intrauterine pregnancy: O00.90

## 2011-07-02 LAB — URINE MICROSCOPIC-ADD ON

## 2011-07-02 LAB — URINALYSIS, ROUTINE W REFLEX MICROSCOPIC
Glucose, UA: NEGATIVE mg/dL
Specific Gravity, Urine: 1.025 (ref 1.005–1.030)

## 2011-07-02 LAB — POCT PREGNANCY, URINE: Preg Test, Ur: NEGATIVE

## 2011-07-02 MED ORDER — METRONIDAZOLE 500 MG PO TABS: 500.0000 mg | ORAL_TABLET | Freq: Two times a day (BID) | ORAL | Status: AC

## 2011-07-02 NOTE — MAU Provider Note (Signed)
History     CSN: [id number]  Arrival date and time: 07/02/11 1510   None     Chief Complaint  Patient presents with  . Spotting    HPI This is a 26 year old female who presents the MAU with 2 a half weeks of vaginal spotting. Her LMP is 06/07/2011, which she describes is normal. She uses 1 pantiliner a day spotting. She also describes intermittent right-sided abdominal cramping pain over the past week, which is nonradiating. She does have normal physiologic discharge. She was seen earlier this week for STD testing, which she states is normal except for "numerous white blood cells".   OB History    Grav Para Term Preterm Abortions TAB SAB Ect Mult Living   3    3  2 1         Past Medical History  Diagnosis Date  . IBS (irritable bowel syndrome) 2009  . Ectopic pregnancy     Past Surgical History  Procedure Date  . Laparoscopy for ectopic pregnancy   . Wisdom tooth extraction     History reviewed. No pertinent family history.  History  Substance Use Topics  . Smoking status: Current Everyday Smoker -- 0.5 packs/day    Types: Cigarettes  . Smokeless tobacco: Not on file  . Alcohol Use: Yes     occasional    Allergies: No Known Allergies  Prescriptions prior to admission  Medication Sig Dispense Refill  . citalopram (CELEXA) 20 MG tablet Take 20 mg by mouth daily.      . diphenoxylate-atropine (LOMOTIL) 2.5-0.025 MG per tablet Take 1 tablet by mouth 4 (four) times daily as needed. For diarrhea/loose stools      . Nutritional Supplements (COLD AND FLU) THERAPY PACK MISC Take by mouth. Sore throat/head ache      . ondansetron (ZOFRAN) 4 MG tablet Take 4 mg by mouth every 8 (eight) hours as needed. For nausea        Review of Systems  Constitutional: Negative for fever, chills and weight loss.  Gastrointestinal: Negative for nausea, vomiting, diarrhea, constipation and blood in stool.  Genitourinary: Negative for dysuria, urgency, frequency and hematuria.    Neurological: Negative for weakness.   Physical Exam   Blood pressure 129/78, pulse 119, temperature 99.7 F (37.6 C), temperature source Oral, resp. rate 16, height 5' (1.524 m), weight 61.689 kg (136 lb), last menstrual period 06/07/2011, SpO2 100.00%.  Physical Exam  Constitutional: She is oriented to person, place, and time. She appears well-developed and well-nourished.  HENT:  Head: Normocephalic.  GI: Soft. Bowel sounds are normal. She exhibits no distension and no mass. There is tenderness (Right pelvic tenderness to palpation). There is no rebound and no guarding.  Genitourinary:       Right adnexal tenderness. Cervix appears normal. Scant blood in the vaginal vault.  Neurological: She is alert and oriented to person, place, and time.  Skin: Skin is warm and dry.  Psychiatric: She has a normal mood and affect. Her behavior is normal. Judgment and thought content normal.   Results for orders placed during the hospital encounter of 07/02/11 (from the past 24 hour(s))  URINALYSIS, ROUTINE W REFLEX MICROSCOPIC     Status: Abnormal   Collection Time   07/02/11  3:27 PM      Component Value Range   Color, Urine YELLOW  YELLOW    APPearance CLEAR  CLEAR    Specific Gravity, Urine 1.025  1.005 - 1.030    pH 6.0  5.0 - 8.0    Glucose, UA NEGATIVE  NEGATIVE (mg/dL)   Hgb urine dipstick LARGE (*) NEGATIVE    Bilirubin Urine NEGATIVE  NEGATIVE    Ketones, ur NEGATIVE  NEGATIVE (mg/dL)   Protein, ur NEGATIVE  NEGATIVE (mg/dL)   Urobilinogen, UA 0.2  0.0 - 1.0 (mg/dL)   Nitrite NEGATIVE  NEGATIVE    Leukocytes, UA NEGATIVE  NEGATIVE   URINE MICROSCOPIC-ADD ON     Status: Abnormal   Collection Time   07/02/11  3:27 PM      Component Value Range   Squamous Epithelial / LPF FEW (*) RARE    WBC, UA 0-2  <3 (WBC/hpf)   RBC / HPF 0-2  <3 (RBC/hpf)   Bacteria, UA RARE  RARE   POCT PREGNANCY, URINE     Status: Normal   Collection Time   07/02/11  3:32 PM      Component Value Range    Preg Test, Ur NEGATIVE  NEGATIVE   WET PREP, GENITAL     Status: Abnormal   Collection Time   07/02/11  4:25 PM      Component Value Range   Yeast Wet Prep HPF POC NONE SEEN  NONE SEEN    Trich, Wet Prep NONE SEEN  NONE SEEN    Clue Cells Wet Prep HPF POC FEW (*) NONE SEEN    WBC, Wet Prep HPF POC MODERATE (*) NONE SEEN      MAU Course  Procedures  MDM Without pregnancy and ectopic pregnancy with negative urine pregnancy test. Urine is negative for urinary tract infection.  Assessment and Plan  #1 bacterial vaginosis  Will prescribe Flagyl and send patient home. Patient returned if symptoms continue or worsen despite treatment for bacterial vaginosis. Handout given.  Adedamola Seto JEHIEL 07/02/2011, 4:15 PM

## 2011-07-02 NOTE — MAU Note (Signed)
Pt states she had STD screening last Monday @ [facility], was negative.

## 2011-07-02 NOTE — MAU Note (Signed)
Pt states rlq pain x1 week, moreso with intercourse, spotting x2 weeks, was treated for Chl end of January or beginning of February. Denies abnormal vaginal d/c changes.

## 2011-07-02 NOTE — Discharge Instructions (Signed)
Bacterial Vaginosis Bacterial vaginosis (BV) is a vaginal infection where the normal balance of bacteria in the vagina is disrupted. The normal balance is then replaced by an overgrowth of certain bacteria. There are several different kinds of bacteria that can cause BV. BV is the most common vaginal infection in women of childbearing age. CAUSES   The cause of BV is not fully understood. BV develops when there is an increase or imbalance of harmful bacteria.   Some activities or behaviors can upset the normal balance of bacteria in the vagina and put women at increased risk including:   Having a new sex partner or multiple sex partners.   Douching.   Using an intrauterine device (IUD) for contraception.   It is not clear what role sexual activity plays in the development of BV. However, women that have never had sexual intercourse are rarely infected with BV.  Women do not get BV from toilet seats, bedding, swimming pools or from touching objects around them.  SYMPTOMS   Grey vaginal discharge.   A fish-like odor with discharge, especially after sexual intercourse.   Itching or burning of the vagina and vulva.   Burning or pain with urination.   Some women have no signs or symptoms at all.  DIAGNOSIS  Your caregiver must examine the vagina for signs of BV. Your caregiver will perform lab tests and look at the sample of vaginal fluid through a microscope. They will look for bacteria and abnormal cells (clue cells), a pH test higher than 4.5, and a positive amine test all associated with BV.  RISKS AND COMPLICATIONS   Pelvic inflammatory disease (PID).   Infections following gynecology surgery.   Developing HIV.   Developing herpes virus.  TREATMENT  Sometimes BV will clear up without treatment. However, all women with symptoms of BV should be treated to avoid complications, especially if gynecology surgery is planned. Female partners generally do not need to be treated. However,  BV may spread between female sex partners so treatment is helpful in preventing a recurrence of BV.   BV may be treated with antibiotics. The antibiotics come in either pill or vaginal cream forms. Either can be used with nonpregnant or pregnant women, but the recommended dosages differ. These antibiotics are not harmful to the baby.   BV can recur after treatment. If this happens, a second round of antibiotics will often be prescribed.   Treatment is important for pregnant women. If not treated, BV can cause a premature delivery, especially for a pregnant woman who had a premature birth in the past. All pregnant women who have symptoms of BV should be checked and treated.   For chronic reoccurrence of BV, treatment with a type of prescribed gel vaginally twice a week is helpful.  HOME CARE INSTRUCTIONS   Finish all medication as directed by your caregiver.   Do not have sex until treatment is completed.   Tell your sexual partner that you have a vaginal infection. They should see their caregiver and be treated if they have problems, such as a mild rash or itching.   Practice safe sex. Use condoms. Only have 1 sex partner.  PREVENTION  Basic prevention steps can help reduce the risk of upsetting the natural balance of bacteria in the vagina and developing BV:  Do not have sexual intercourse (be abstinent).   Do not douche.   Use all of the medicine prescribed for treatment of BV, even if the signs and symptoms go away.     Tell your sex partner if you have BV. That way, they can be treated, if needed, to prevent reoccurrence.  SEEK MEDICAL CARE IF:   Your symptoms are not improving after 3 days of treatment.   You have increased discharge, pain, or fever.  MAKE SURE YOU:   Understand these instructions.   Will watch your condition.   Will get help right away if you are not doing well or get worse.  FOR MORE INFORMATION  Division of STD Prevention (DSTDP), Centers for Disease  Control and Prevention: www.cdc.gov/std American Social Health Association (ASHA): www.ashastd.org  Document Released: 02/17/2005 Document Revised: 02/06/2011 Document Reviewed: 08/10/2008 Exit[employer] Patient Information 2012 [employer]. 

## 2011-09-27 ENCOUNTER — Emergency Department (HOSPITAL_COMMUNITY)
Admission: EM | Admit: 2011-09-27 | Discharge: 2011-09-27 | Disposition: A | Discharge status: Home / Self Care | Payer: Self-pay | Attending: Emergency Medicine | Admitting: Emergency Medicine

## 2011-09-27 ENCOUNTER — Encounter (HOSPITAL_COMMUNITY): Payer: Self-pay | Admitting: *Deleted

## 2011-09-27 DIAGNOSIS — J069 Acute upper respiratory infection, unspecified: Secondary | ICD-10-CM

## 2011-09-27 DIAGNOSIS — J029 Acute pharyngitis, unspecified: Secondary | ICD-10-CM | POA: Insufficient documentation

## 2011-09-27 DIAGNOSIS — F17200 Nicotine dependence, unspecified, uncomplicated: Secondary | ICD-10-CM | POA: Insufficient documentation

## 2011-09-27 LAB — RAPID STREP SCREEN (MED CTR MEBANE ONLY): Streptococcus, Group A Screen (Direct): NEGATIVE

## 2011-09-27 MED ORDER — HYDROCODONE-ACETAMINOPHEN 7.5-500 MG/15ML PO SOLN: 15.0000 mL | Freq: Four times a day (QID) | ORAL | Status: AC | PRN

## 2011-09-27 MED ORDER — ACETAMINOPHEN 325 MG PO TABS
650.0000 mg | ORAL_TABLET | Freq: Once | ORAL | Status: AC
  Administered 2011-09-27: 650 mg via ORAL
  Filled 2011-09-27: qty 2

## 2011-09-27 MED ORDER — SODIUM CHLORIDE 0.9 % IV BOLUS (SEPSIS)
1000.0000 mL | Freq: Once | INTRAVENOUS | Status: AC
  Administered 2011-09-27 (×2): 1000 mL via INTRAVENOUS

## 2011-09-27 NOTE — ED Notes (Signed)
Acetaminophen not given at 10:09 dropped 1 pill onto the floor. Problems with Pyxis and override for another tablet. Doctor had to reorder medication.

## 2011-09-27 NOTE — ED Notes (Signed)
Patient being discharged with her mother , Instructions given per teach back method.

## 2011-09-27 NOTE — ED Notes (Signed)
Pt reports sore throat for several days, has gotten worse and now having body aches and chills.

## 2011-09-27 NOTE — ED Provider Notes (Signed)
History     CSN: [id number]  Arrival date & time 09/27/11  0904   First MD Initiated Contact with Patient 09/27/11 0940      Chief Complaint  Patient presents with  . Sore Throat  . Fever    (Consider location/radiation/quality/duration/timing/severity/associated sxs/prior treatment) HPI  26 year old female presents with cold symptoms. Patient states for the past 2 days she has had gradual onset of headache, sore throat, sneezing, runny nose, ear pain, and fever. She reports that her sore throat has been bothering her the most. She has tried DayQuil, and gargle with saltwater with minimal improvement. She denies nausea, vomiting, chest pain, shortness of breath, voice changes, productive cough. She reports having prior history of strep throat in the past. She denies abdominal pain.  Past Medical History  Diagnosis Date  . IBS (irritable bowel syndrome) 2009  . Ectopic pregnancy     Past Surgical History  Procedure Date  . Laparoscopy for ectopic pregnancy   . Wisdom tooth extraction     History reviewed. No pertinent family history.  History  Substance Use Topics  . Smoking status: Current Everyday Smoker -- 0.5 packs/day    Types: Cigarettes  . Smokeless tobacco: Not on file  . Alcohol Use: Yes     occasional    OB History    Grav Para Term Preterm Abortions TAB SAB Ect Mult Living   3    3  2 1         Review of Systems  All other systems reviewed and are negative.    Allergies  Review of patient's allergies indicates no known allergies.  Home Medications   Current Outpatient Rx  Name Route Sig Dispense Refill  . CITALOPRAM HYDROBROMIDE 20 MG PO TABS Oral Take 20 mg by mouth daily.      BP 114/74  Pulse 120  Temp 100.8 F (38.2 C) (Oral)  Resp 14  SpO2 98%  LMP 09/27/2011  Physical Exam  Nursing note and vitals reviewed. Constitutional: She appears well-developed and well-nourished. No distress.       Awake, alert, nontoxic appearance    HENT:  Head: Atraumatic. No trismus in the jaw.  Right Ear: Hearing, tympanic membrane, external ear and ear canal normal.  Left Ear: Hearing, tympanic membrane, external ear and ear canal normal.  Nose: Nose normal.  Mouth/Throat: Uvula is midline and mucous membranes are normal. No uvula swelling. Oropharyngeal exudate, posterior oropharyngeal edema and posterior oropharyngeal erythema present. No tonsillar abscesses.  Eyes: Conjunctivae are normal. Right eye exhibits no discharge. Left eye exhibits no discharge.  Neck: Neck supple.  Cardiovascular: Normal rate and regular rhythm.   Pulmonary/Chest: Effort normal. No respiratory distress. She exhibits no tenderness.  Abdominal: Soft. There is no tenderness. There is no rebound.  Musculoskeletal: She exhibits no tenderness.       ROM appears intact, no obvious focal weakness  Neurological:       Mental status and motor strength appears intact  Skin: No rash noted.  Psychiatric: She has a normal mood and affect.    ED Course  Procedures (including critical care time)   Labs Reviewed  RAPID STREP SCREEN   No results found.   No diagnosis found.  Results for orders placed during the hospital encounter of 09/27/11  RAPID STREP SCREEN      Component Value Range   Streptococcus, Group A Screen (Direct) NEGATIVE  NEGATIVE   No results found.  1. Viral pharyngitis 2. URI  MDM  Patient with fever, sore throat, and cold symptoms. On exam no evidence of peritonsillar abscess or Ludwig's angina.  Rapid strep test given.  Tylenol and fluid given for fever and tachycardia.  Pt otherwise appears non toxic.  No voice changes.    12:02 PM After IVF, pt's vs normalized.  Stable to be discharge.  Able to tolerates PO  BP 108/62  Pulse 102  Temp 99.6 F (37.6 C) (Oral)  Resp 18  SpO2 99%  LMP 09/27/2011       Fayrene Helper, PA-C 09/27/11 1202

## 2011-09-27 NOTE — ED Notes (Signed)
1000 cc of NS has infused site clear. IV Discontinued

## 2011-11-05 ENCOUNTER — Emergency Department (HOSPITAL_COMMUNITY)
Admission: EM | Admit: 2011-11-05 | Discharge: 2011-11-05 | Disposition: A | Discharge status: Home / Self Care | Payer: Self-pay | Attending: Emergency Medicine | Admitting: Emergency Medicine

## 2011-11-05 ENCOUNTER — Encounter (HOSPITAL_COMMUNITY): Payer: Self-pay | Admitting: *Deleted

## 2011-11-05 DIAGNOSIS — F411 Generalized anxiety disorder: Secondary | ICD-10-CM | POA: Insufficient documentation

## 2011-11-05 DIAGNOSIS — K589 Irritable bowel syndrome without diarrhea: Secondary | ICD-10-CM | POA: Insufficient documentation

## 2011-11-05 DIAGNOSIS — F419 Anxiety disorder, unspecified: Secondary | ICD-10-CM

## 2011-11-05 HISTORY — DX: Anxiety disorder, unspecified: F41.9

## 2011-11-05 HISTORY — DX: Major depressive disorder, single episode, unspecified: F32.9

## 2011-11-05 HISTORY — DX: Depression, unspecified: F32.A

## 2011-11-05 LAB — POCT PREGNANCY, URINE: Preg Test, Ur: NEGATIVE

## 2011-11-05 LAB — URINALYSIS, ROUTINE W REFLEX MICROSCOPIC
Glucose, UA: NEGATIVE mg/dL
Ketones, ur: 15 mg/dL — AB
Leukocytes, UA: NEGATIVE
Protein, ur: NEGATIVE mg/dL
Urobilinogen, UA: 1 mg/dL (ref 0.0–1.0)

## 2011-11-05 LAB — URINE MICROSCOPIC-ADD ON

## 2011-11-05 MED ORDER — DIAZEPAM 5 MG PO TABS
5.0000 mg | ORAL_TABLET | Freq: Once | ORAL | Status: AC
  Administered 2011-11-05: 5 mg via ORAL
  Filled 2011-11-05: qty 1

## 2011-11-05 MED ORDER — DIAZEPAM 5 MG PO TABS: 5.0000 mg | ORAL_TABLET | Freq: Three times a day (TID) | ORAL | Status: AC | PRN

## 2011-11-05 NOTE — ED Notes (Signed)
Pt discharged with mother, pt ambulatory at discharge.

## 2011-11-05 NOTE — ED Provider Notes (Signed)
History     CSN: [id number]  Arrival date & time 11/05/11  1840   First MD Initiated Contact with Patient 11/05/11 2016      Chief Complaint  Patient presents with  . Anxiety    (Consider location/radiation/quality/duration/timing/severity/associated sxs/prior treatment) HPI Comments: Patient with history of depression and anxiety presents emergency department with chief complaint of anxiety attack.  She states that she was on Celexa for 8 months but stopped over the last month due to side effect causing drowsiness.  Patient has been suffering over bruising on her arm believing that she has a terminal illness and she is going to die.  Mother reports that this has happened before in the past.  Patient denies a history of self harm or suicidal ideations.  She does report insomnia and decreased appetite.  Patient has been followed by a psychiatrist in the past but not recently.  Patient went to [facility] today and scheduled a outpatient appointment for 8 a.m. on Friday.  Patient denies any suicidal or homicidal ideations.  She reports that she did not have any visual or auditory hallucinations as well.  Patient is a 26 y.o. female presenting with anxiety. The history is provided by the patient.  Anxiety This is a recurrent problem. The current episode started today. The problem occurs constantly. The problem has been unchanged. Pertinent negatives include no abdominal pain, chest pain, chills, coughing, diaphoresis, fever, headaches, myalgias, nausea, neck pain, numbness, rash, vomiting or weakness.    Past Medical History  Diagnosis Date  . IBS (irritable bowel syndrome) 2009  . Ectopic pregnancy   . Anxiety   . Depression     Past Surgical History  Procedure Date  . Laparoscopy for ectopic pregnancy   . Wisdom tooth extraction     History reviewed. No pertinent family history.  History  Substance Use Topics  . Smoking status: Current Everyday Smoker -- 0.5 packs/day    Types:  Cigarettes  . Smokeless tobacco: Not on file  . Alcohol Use: Yes     occasional    OB History    Grav Para Term Preterm Abortions TAB SAB Ect Mult Living   3    3  2 1         Review of Systems  Constitutional: Negative for fever, chills, diaphoresis and appetite change.  HENT: Negative for hearing loss, ear pain, trouble swallowing, neck pain, neck stiffness and tinnitus.   Eyes: Negative for photophobia, pain and visual disturbance.  Respiratory: Negative for cough, shortness of breath, wheezing and stridor.   Cardiovascular: Positive for palpitations. Negative for chest pain.  Gastrointestinal: Negative for nausea, vomiting and abdominal pain.  Genitourinary: Negative for dysuria, urgency and difficulty urinating.  Musculoskeletal: Negative for myalgias and back pain.  Skin: Negative for color change, pallor and rash.  Neurological: Negative for dizziness, seizures, syncope, facial asymmetry, speech difficulty, weakness, light-headedness, numbness and headaches.  Hematological: Does not bruise/bleed easily.  Psychiatric/Behavioral: Positive for disturbed wake/sleep cycle. Negative for suicidal ideas, hallucinations, behavioral problems, confusion, self-injury, dysphoric mood, decreased concentration and agitation. The patient is nervous/anxious. The patient is not hyperactive.   All other systems reviewed and are negative.    Allergies  Review of patient's allergies indicates no known allergies.  Home Medications   Current Outpatient Rx  Name Route Sig Dispense Refill  . CITALOPRAM HYDROBROMIDE 20 MG PO TABS Oral Take 20 mg by mouth daily.      BP 123/75  Pulse 117  Temp 98.3 F (  36.8 C) (Oral)  Resp 16  SpO2 96%  Physical Exam  Nursing note and vitals reviewed. Constitutional: She is oriented to person, place, and time. She appears well-developed and well-nourished. No distress.       Patient not in visible distress  HENT:  Head: Normocephalic and atraumatic.    Eyes: Conjunctivae and EOM are normal. Pupils are equal, round, and reactive to light.  Neck: Normal range of motion. Neck supple. Normal carotid pulses, no hepatojugular reflux and no JVD present. Carotid bruit is not present.       No carotid bruits  Cardiovascular:       RRR, no aberrancy on auscultation, intact distal pulses no pitting edema bilaterally.  Pulmonary/Chest: Effort normal and breath sounds normal. No respiratory distress.       Lungs clear to auscultation bilaterally  Abdominal: Soft. She exhibits no distension. There is no tenderness.       Nonpulsatile aorta  Musculoskeletal: Normal range of motion. She exhibits no tenderness.  Neurological: She is alert and oriented to person, place, and time. She displays a negative Romberg sign.       Cranial nerves III through XII intact, normal coordination, negative Romberg, strength 5/5 bilaterally. No ataxia  Skin: Skin is warm and dry. No pallor.  Psychiatric: She has a normal mood and affect. Her behavior is normal.    ED Course  Procedures (including critical care time)  Labs Reviewed  URINALYSIS, ROUTINE W REFLEX MICROSCOPIC - Abnormal; Notable for the following:    APPearance CLOUDY (*)     Hgb urine dipstick SMALL (*)     Ketones, ur 15 (*)     All other components within normal limits  URINE MICROSCOPIC-ADD ON - Abnormal; Notable for the following:    Squamous Epithelial / LPF MANY (*)     All other components within normal limits  POCT PREGNANCY, URINE   No results found.  Date: 11/05/2011  Rate: 75  Rhythm: normal sinus rhythm  QRS Axis: normal  Intervals: normal  ST/T Wave abnormalities: normal  Conduction Disutrbances: none  Narrative Interpretation:   Old EKG Reviewed: No significant changes noted     No diagnosis found.    MDM  Anxiety  Patient presents to the emergency department complaining of symptoms consistent with anxiety.  Patient has a history of same with similar episodes.  The  patient is resting comfortably, in no apparent distress and asymptomatic.  Labs, ECG and vital signs reviewed.  No exophthalmos, pregnancy test negative, no signs of UTI.  Stress reducing mechanisms discussed including caffeine intake.  Patient has been referred to psychiatric services for follow-up.  Discharged with a 3 day prescription of benzo.            Jaci Carrel, PA-C 11/05/11 2203  Jaci Carrel, PA-C 11/05/11 2218

## 2011-11-05 NOTE — ED Notes (Signed)
Pt reports hx of anxiety. States she has bruise to left arm and states extreme anxiety associated with bruise to arm, states she feels like she is going to die of a terminal illness. Pt states she went to [facility] to be seen and they can't see her till Friday. Pt states she had been celexa for anxiety/depression. Pt reports unable to sleep or eat.

## 2011-11-06 NOTE — ED Provider Notes (Signed)
Medical screening examination/treatment/procedure(s) were performed by non-physician practitioner and as supervising physician I was immediately available for consultation/collaboration.  Jones Skene, M.D.     Jones Skene, MD 11/06/11 1233

## 2011-12-05 ENCOUNTER — Emergency Department (HOSPITAL_COMMUNITY)
Admission: EM | Admit: 2011-12-05 | Discharge: 2011-12-05 | Disposition: A | Discharge status: Home / Self Care | Payer: Self-pay | Attending: Emergency Medicine | Admitting: Emergency Medicine

## 2011-12-05 ENCOUNTER — Encounter (HOSPITAL_COMMUNITY): Payer: Self-pay

## 2011-12-05 DIAGNOSIS — F329 Major depressive disorder, single episode, unspecified: Secondary | ICD-10-CM | POA: Insufficient documentation

## 2011-12-05 DIAGNOSIS — F411 Generalized anxiety disorder: Secondary | ICD-10-CM | POA: Insufficient documentation

## 2011-12-05 DIAGNOSIS — F3289 Other specified depressive episodes: Secondary | ICD-10-CM | POA: Insufficient documentation

## 2011-12-05 DIAGNOSIS — K589 Irritable bowel syndrome without diarrhea: Secondary | ICD-10-CM | POA: Insufficient documentation

## 2011-12-05 DIAGNOSIS — F17200 Nicotine dependence, unspecified, uncomplicated: Secondary | ICD-10-CM | POA: Insufficient documentation

## 2011-12-05 DIAGNOSIS — L819 Disorder of pigmentation, unspecified: Secondary | ICD-10-CM | POA: Insufficient documentation

## 2011-12-05 NOTE — ED Notes (Signed)
Pt here for left arm skin discoloration x 3 years is getting worst and is spreading. Pt is panic due to the unknown of her skin discoloration.

## 2011-12-05 NOTE — ED Provider Notes (Signed)
History     CSN: [id number]  Arrival date & time 12/05/11  1722   First MD Initiated Contact with Patient 12/05/11 1813      Chief Complaint  Patient presents with  . Skin Discoloration    (Consider location/radiation/quality/duration/timing/severity/associated sxs/prior treatment) HPI  25 y.o. -year-old female in no acute distress presenting for evaluation of painless, nonpruritic area of hyperpigmentation to left arm. Patient states that she has had this for 4 years however she noticed 2 spots this morning. States that she has panic disorder and that this is very stressful to her. Denies fever, weight loss, change in appetite, chest pain, nausea vomiting, change in bowel or bladder habits.  Past Medical History  Diagnosis Date  . IBS (irritable bowel syndrome) 2009  . Ectopic pregnancy   . Anxiety   . Depression     Past Surgical History  Procedure Date  . Laparoscopy for ectopic pregnancy   . Wisdom tooth extraction     No family history on file.  History  Substance Use Topics  . Smoking status: Current Every Day Smoker -- 0.5 packs/day    Types: Cigarettes  . Smokeless tobacco: Not on file  . Alcohol Use: Yes     occasional    OB History    Grav Para Term Preterm Abortions TAB SAB Ect Mult Living   3    3  2 1         Review of Systems  Constitutional: Negative for fever.  Respiratory: Negative for shortness of breath.   Cardiovascular: Negative for chest pain.  Gastrointestinal: Negative for nausea, vomiting, abdominal pain and diarrhea.  Skin: Positive for rash.  All other systems reviewed and are negative.    Allergies  Review of patient's allergies indicates no known allergies.  Home Medications   Current Outpatient Rx  Name Route Sig Dispense Refill  . DIAZEPAM 5 MG PO TABS Oral Take 5 mg by mouth every 8 (eight) hours as needed. Anxiety    . ESCITALOPRAM OXALATE 10 MG PO TABS Oral Take 10 mg by mouth daily.    Marland Kitchen HYDROXYZINE HCL 25 MG PO  TABS Oral Take 25 mg by mouth 3 (three) times daily as needed. Anxiety    . QUETIAPINE FUMARATE 25 MG PO TABS Oral Take 12.5 mg by mouth at bedtime. Take 1/2 tablet in the AM and 1 tablet in the PM      BP 119/80  Pulse 100  Temp 98.7 F (37.1 C) (Oral)  Resp 16  SpO2 100%  LMP 11/28/2011  Physical Exam  Nursing note and vitals reviewed. Constitutional: She is oriented to person, place, and time. She appears well-developed and well-nourished. No distress.  HENT:  Head: Normocephalic.  Eyes: Conjunctivae normal and EOM are normal.  Cardiovascular: Normal rate.   Pulmonary/Chest: Effort normal. No stridor.  Musculoskeletal: Normal range of motion.  Neurological: She is alert and oriented to person, place, and time.  Skin:       Scattered, mildly hypertrophic,  hyperpigmented macules to the left arm.  Psychiatric: She has a normal mood and affect.    ED Course  Procedures (including critical care time)  Labs Reviewed - No data to display No results found.   1. Hyperpigmentation of skin       MDM  Reassured patient that this is a non-infectious likely benign condition but also advised her that she must follow with dermatology for definitive diagnosis. Pt verbalized understanding and agrees with care plan. Outpatient follow-up  and return precautions given.           Wynetta Emery, PA-C 12/05/11 1829

## 2011-12-09 NOTE — ED Provider Notes (Signed)
Medical screening examination/treatment/procedure(s) were performed by non-physician practitioner and as supervising physician I was immediately available for consultation/collaboration.  Raeford Razor, MD 12/09/11 [phone]

## 2012-03-03 HISTORY — PX: DILATION AND EVACUATION: SHX1459

## 2012-05-26 ENCOUNTER — Other Ambulatory Visit: Payer: Self-pay | Admitting: Obstetrics and Gynecology

## 2012-05-28 ENCOUNTER — Inpatient Hospital Stay (HOSPITAL_COMMUNITY)
Admission: AD | Admit: 2012-05-28 | Discharge: 2012-05-29 | Disposition: A | Discharge status: Home / Self Care | Payer: No Typology Code available for payment source | Source: Ambulatory Visit | Attending: Obstetrics and Gynecology | Admitting: Obstetrics and Gynecology

## 2012-05-28 ENCOUNTER — Inpatient Hospital Stay (HOSPITAL_COMMUNITY): Payer: No Typology Code available for payment source

## 2012-05-28 ENCOUNTER — Encounter (HOSPITAL_COMMUNITY): Payer: Self-pay | Admitting: *Deleted

## 2012-05-28 DIAGNOSIS — O209 Hemorrhage in early pregnancy, unspecified: Secondary | ICD-10-CM | POA: Insufficient documentation

## 2012-05-28 DIAGNOSIS — O418X10 Other specified disorders of amniotic fluid and membranes, first trimester, not applicable or unspecified: Secondary | ICD-10-CM

## 2012-05-28 DIAGNOSIS — O368990 Maternal care for other specified fetal problems, unspecified trimester, not applicable or unspecified: Secondary | ICD-10-CM

## 2012-05-28 MED ORDER — RHO D IMMUNE GLOBULIN 1500 UNIT/2ML IJ SOLN
300.0000 ug | Freq: Once | INTRAMUSCULAR | Status: AC
  Administered 2012-05-29: 300 ug via INTRAMUSCULAR
  Filled 2012-05-28: qty 2

## 2012-05-28 NOTE — MAU Provider Note (Signed)
History     CSN: [id number]  Arrival date and time: 05/28/12 1508   First Provider Initiated Contact with Patient 05/28/12 1838      Chief Complaint  Patient presents with  . Vaginal Bleeding   HPI Ms. ZIYAH CORDOBA is a 27 y.o. G3P0020 at [redacted]w[redacted]d who presents to MAU today with brown discharge. The patient had a pap smear in the office on Wednesday and the brown discharge started today. The patient states that she called the office prior to coming in and they told her that the [place] from Wednesday showed that the baby had a low heart rate so she should come to MAU for evaluation. The patient denies abdominal pain or bright red bleeding.   OB History   Grav Para Term Preterm Abortions TAB SAB Ect Mult Living   3    2  1 1         Past Medical History  Diagnosis Date  . IBS (irritable bowel syndrome) 2009  . Ectopic pregnancy   . Anxiety   . Depression     Past Surgical History  Procedure Laterality Date  . Laparoscopy for ectopic pregnancy    . Wisdom tooth extraction      History reviewed. No pertinent family history.  History  Substance Use Topics  . Smoking status: Former Smoker -- 0.50 packs/day    Types: Cigarettes  . Smokeless tobacco: Not on file  . Alcohol Use: No    Allergies: No Known Allergies  Prescriptions prior to admission  Medication Sig Dispense Refill  . diazepam (VALIUM) 5 MG tablet Take 5 mg by mouth every 8 (eight) hours as needed. Anxiety      . escitalopram (LEXAPRO) 10 MG tablet Take 10 mg by mouth daily.      . hydrOXYzine (ATARAX/VISTARIL) 25 MG tablet Take 25 mg by mouth 3 (three) times daily as needed. Anxiety      . QUEtiapine (SEROQUEL) 25 MG tablet Take 12.5 mg by mouth at bedtime. Take 1/2 tablet in the AM and 1 tablet in the PM        Review of Systems  Gastrointestinal: Negative for abdominal pain.  Genitourinary:       + vaginal bleeding   Physical Exam   Blood pressure 116/78, pulse 85, temperature 98.2 F (36.8 C),  temperature source Oral, resp. rate 18, height 5' 0.5" (1.537 m), weight 137 lb (62.143 kg), last menstrual period 04/05/2012.  Physical Exam  Constitutional: She is oriented to person, place, and time. She appears well-developed and well-nourished. No distress.  HENT:  Head: Normocephalic and atraumatic.  Cardiovascular: Normal rate, regular rhythm and normal heart sounds.   Respiratory: Effort normal and breath sounds normal. No respiratory distress.  GI: Soft. Bowel sounds are normal. She exhibits no distension and no mass. There is no tenderness. There is no rebound and no guarding.  Genitourinary: Vagina normal. Uterus is not enlarged and not tender. Cervix exhibits discharge (small amount of brown discharge noted at the cervical os). Cervix exhibits no motion tenderness and no friability.  Neurological: She is alert and oriented to person, place, and time.  Skin: Skin is warm and dry. No erythema.  Psychiatric: She has a normal mood and affect.   *RADIOLOGY REPORT*  Clinical Data: Vaginal bleeding. 6 weeks and 3 days pregnant by  previous ultrasound elsewhere. Low fetal heart rate in the  physician's office 2 days ago.  OBSTETRIC <14 WK ULTRASOUND  Technique: Transabdominal ultrasound was performed for  evaluation  of the gestation as well as the maternal uterus and adnexal  regions.  Comparison: Previous examinations, the most recent dated  04/11/2010.  Intrauterine gestational sac: Visualized/normal in shape.  Yolk sac: Visualized/normal in shape.  Embryo: Visualized.  Cardiac Activity: Visualized.  Heart Rate: 85 bpm  CRL: 4.2 mm 6 w 1 d [place] EDC: 01/20/2013  Maternal uterus/Adnexae:  Small subchorionic hemorrhage. Normal appearing maternal ovaries.  No free peritoneal fluid.  IMPRESSION:  1. Single live intrauterine gestation with an estimated  gestational age of the 6 weeks of 1 day.  2. Low fetal heart rate of 85 beats per minute. This is at the  low end of the equivocal  range for poor outcome. Close follow-up  is recommended.  Original Report Authenticated By: Beckie Salts, M.D.  MAU Course  Procedures None  MDM Discussed patient with Dr. Dareen Piano. He agrees that patient may have [place] today and follow-up in the office as scheduled.  Patient blood type 0 neg from previous admissions Rhogam work-up and injection in MAU today Assessment and Plan  A: Vaginal bleeding in early pregnancy IUP at 6w 1d with low fetal heart rate Subchorionic hemorrhage  P: Discharge home Discussed possibility of outcome/cause of bleeding and low fetal heart rate Bleeding precautions discussed Patient encouraged to follow-up in the office on Tuesday as scheduled Patient may return to MAU as needed or if her condition were to change or worsen  Freddi Starr, PA-C  05/28/2012, 6:41 PM

## 2012-05-28 NOTE — MAU Note (Signed)
Had pap smear in office on Wed.  Has had some brownish spotting. Had called and left message, MD called back told pt, FH was low on [place] (Wed) also, can't get her in for a couple wks.

## 2012-05-29 LAB — RH IG WORKUP (INCLUDES ABO/RH): Antibody Screen: NEGATIVE

## 2012-05-29 NOTE — Progress Notes (Signed)
Written and verbal d/c instructions given and understanding voiced. 

## 2012-05-30 ENCOUNTER — Inpatient Hospital Stay (HOSPITAL_COMMUNITY)
Admission: AD | Admit: 2012-05-30 | Discharge: 2012-05-30 | Disposition: A | Discharge status: Home / Self Care | Payer: No Typology Code available for payment source | Source: Ambulatory Visit | Attending: Obstetrics and Gynecology | Admitting: Obstetrics and Gynecology

## 2012-05-30 ENCOUNTER — Encounter (HOSPITAL_COMMUNITY): Payer: Self-pay | Admitting: Family

## 2012-05-30 DIAGNOSIS — R109 Unspecified abdominal pain: Secondary | ICD-10-CM | POA: Insufficient documentation

## 2012-05-30 DIAGNOSIS — O210 Mild hyperemesis gravidarum: Secondary | ICD-10-CM | POA: Insufficient documentation

## 2012-05-30 DIAGNOSIS — O219 Vomiting of pregnancy, unspecified: Secondary | ICD-10-CM

## 2012-05-30 DIAGNOSIS — M545 Low back pain, unspecified: Secondary | ICD-10-CM | POA: Insufficient documentation

## 2012-05-30 LAB — URINALYSIS, ROUTINE W REFLEX MICROSCOPIC
Bilirubin Urine: NEGATIVE
Glucose, UA: NEGATIVE mg/dL
Hgb urine dipstick: NEGATIVE
Protein, ur: NEGATIVE mg/dL
Specific Gravity, Urine: 1.015 (ref 1.005–1.030)

## 2012-05-30 MED ORDER — ONDANSETRON HCL 4 MG PO TABS
8.0000 mg | ORAL_TABLET | Freq: Once | ORAL | Status: AC
  Administered 2012-05-30: 8 mg via ORAL
  Filled 2012-05-30: qty 2

## 2012-05-30 MED ORDER — GI COCKTAIL ~~LOC~~
30.0000 mL | Freq: Once | ORAL | Status: AC
  Administered 2012-05-30: 30 mL via ORAL
  Filled 2012-05-30: qty 30

## 2012-05-30 MED ORDER — PROMETHAZINE HCL 25 MG RE SUPP
25.0000 mg | Freq: Four times a day (QID) | RECTAL | Status: DC | PRN
  Administered 2012-05-30: 25 mg via RECTAL
  Filled 2012-05-30: qty 1

## 2012-05-30 MED ORDER — PROMETHAZINE HCL 25 MG PO TABS: 25.0000 mg | ORAL_TABLET | Freq: Four times a day (QID) | ORAL | Status: DC | PRN

## 2012-05-30 NOTE — MAU Provider Note (Signed)
History     CSN: [id number]  Arrival date & time 05/30/12  1216   None     Chief Complaint  Patient presents with  . Emesis During Pregnancy  . Abdominal Pain  . Back Pain    (Consider location/radiation/quality/duration/timing/severity/associated sxs/prior treatment) HPI Brandi Lucero is a 27 y.o. G3P0020 at [redacted]w[redacted]d. She returns with c/o vomiting since 6pm yesterday. She tried Zofran x 1, helped a little. She estimates has vomited x 6 in 24 hrs, has burning in her chest. She c/o headache today.  She continues to have brown/yellow vaginal discharge, low back ache. U/S 3/28 showed low FHR, she has an appt 4/1 in the office for re evaluation.   Past Medical History  Diagnosis Date  . IBS (irritable bowel syndrome) 2009  . Ectopic pregnancy   . Anxiety   . Depression     Past Surgical History  Procedure Laterality Date  . Laparoscopy for ectopic pregnancy    . Wisdom tooth extraction      History reviewed. No pertinent family history.  History  Substance Use Topics  . Smoking status: Former Smoker -- 0.50 packs/day    Types: Cigarettes  . Smokeless tobacco: Not on file  . Alcohol Use: No    OB History   Grav Para Term Preterm Abortions TAB SAB Ect Mult Living   3    2  1 1         Review of Systems  Constitutional: Negative for fever and chills.  Gastrointestinal: Positive for nausea and vomiting. Negative for diarrhea.  Genitourinary: Positive for vaginal discharge. Negative for dysuria, urgency, frequency and vaginal bleeding.  Neurological: Positive for headaches.    Allergies  Review of patient's allergies indicates no known allergies.  Home Medications  No current outpatient prescriptions on file.  BP 111/69  Pulse 86  Temp(Src) 98.9 F (37.2 C) (Oral)  Resp 16  Ht 5\' 1"  (1.549 m)  Wt 134 lb (60.782 kg)  BMI 25.33 kg/m2  SpO2 100%  LMP 04/05/2012  Physical Exam  Constitutional: She is oriented to person, place, and time. She appears  well-developed and well-nourished.  Abdominal: Soft. There is no tenderness.  Musculoskeletal: Normal range of motion.  Neurological: She is alert and oriented to person, place, and time.  Skin: Skin is warm and dry.  Psychiatric: She has a normal mood and affect. Her behavior is normal.    ED Course  Procedures (including critical care time)  Labs Reviewed  URINALYSIS, ROUTINE W REFLEX MICROSCOPIC - Abnormal; Notable for the following:    Urobilinogen, UA 2.0 (*)    All other components within normal limits   US Ob Comp Less 14 Wks  05/28/2012  *RADIOLOGY REPORT*  Clinical Data: Vaginal bleeding.  6 weeks and 3 days pregnant by previous ultrasound elsewhere.  Low fetal heart rate in the physician's office 2 days ago.  OBSTETRIC <14 WK ULTRASOUND  Technique:  Transabdominal ultrasound was performed for evaluation of the gestation as well as the maternal uterus and adnexal regions.  Comparison:  Previous examinations, the most recent dated 04/11/2010.  Intrauterine gestational sac: Visualized/normal in shape. Yolk sac: Visualized/normal in shape. Embryo: Visualized. Cardiac Activity: Visualized. Heart Rate: 85 bpm  CRL:  4.2 mm  6 w  1 d        [place] EDC: 01/20/2013  Maternal uterus/Adnexae: Small subchorionic hemorrhage.  Normal appearing maternal ovaries. No free peritoneal fluid.  IMPRESSION:  1.  Single live intrauterine gestation with an estimated  gestational age of the 6 weeks of 1 day. 2.  Low fetal heart rate of 85 beats per minute.  This is at the low end of the equivocal range for poor outcome.  Close follow-up is recommended.   Original Report Authenticated By: Beckie Salts, M.D.    US Ob Transvaginal  05/28/2012  *RADIOLOGY REPORT*  Clinical Data: Vaginal bleeding.  6 weeks and 3 days pregnant by previous ultrasound elsewhere.  Low fetal heart rate in the physician's office 2 days ago.  OBSTETRIC <14 WK ULTRASOUND  Technique:  Transabdominal ultrasound was performed for evaluation of the  gestation as well as the maternal uterus and adnexal regions.  Comparison:  Previous examinations, the most recent dated 04/11/2010.  Intrauterine gestational sac: Visualized/normal in shape. Yolk sac: Visualized/normal in shape. Embryo: Visualized. Cardiac Activity: Visualized. Heart Rate: 85 bpm  CRL:  4.2 mm  6 w  1 d        [place] EDC: 01/20/2013  Maternal uterus/Adnexae: Small subchorionic hemorrhage.  Normal appearing maternal ovaries. No free peritoneal fluid.  IMPRESSION:  1.  Single live intrauterine gestation with an estimated gestational age of the 6 weeks of 1 day. 2.  Low fetal heart rate of 85 beats per minute.  This is at the low end of the equivocal range for poor outcome.  Close follow-up is recommended.   Original Report Authenticated By: Beckie Salts, M.D.    3:55 pm Pt had been feeling better after GI cocktail and Phenergan supp. Was trying po's, started vomiting again. Rx Zofran 8 mg po.  No diagnosis found.  4:45pm Pt feeling better after Zofran, kept ginger ale and crackers down.She is ready to go home. Will Rx Phenergan tabs for home use also.  She has an appt 4/1 in the office for repeat U/S.    MDM  ASSESSMENT:  7 6/7 wks with vomiting  PLAN:  Rx for Phenergan tabs, diet reviewed Keep appt Tuesday in the office

## 2012-05-30 NOTE — MAU Note (Signed)
Patient states she has been unable to keep anything down for 2 days. Having abdominal and back pain. Denies bleeding or discharge.

## 2012-05-30 NOTE — MAU Note (Signed)
Patient presents to MAU with complaints of N/V since 1800 yesterday. Reports emesis x 6 in 24 hours; currently has headache. Vomiting unrelieved by Zofran, which she took last at 1830 last night.  Denies diarrhea, fever, or chills. Also denies vaginal bleeding, discharge or cramping.

## 2012-06-03 ENCOUNTER — Other Ambulatory Visit: Payer: Self-pay | Admitting: Obstetrics and Gynecology

## 2013-04-02 ENCOUNTER — Encounter (HOSPITAL_COMMUNITY): Payer: Self-pay | Admitting: *Deleted

## 2014-01-02 ENCOUNTER — Encounter (HOSPITAL_COMMUNITY): Payer: Self-pay | Admitting: *Deleted

## 2014-01-24 ENCOUNTER — Emergency Department (HOSPITAL_COMMUNITY)
Admission: EM | Admit: 2014-01-24 | Discharge: 2014-01-24 | Disposition: A | Discharge status: Home / Self Care | Payer: No Typology Code available for payment source | Attending: Emergency Medicine | Admitting: Emergency Medicine

## 2014-01-24 ENCOUNTER — Encounter (HOSPITAL_COMMUNITY): Payer: Self-pay | Admitting: Emergency Medicine

## 2014-01-24 DIAGNOSIS — Z87891 Personal history of nicotine dependence: Secondary | ICD-10-CM | POA: Insufficient documentation

## 2014-01-24 DIAGNOSIS — Z79899 Other long term (current) drug therapy: Secondary | ICD-10-CM | POA: Diagnosis not present

## 2014-01-24 DIAGNOSIS — Z8719 Personal history of other diseases of the digestive system: Secondary | ICD-10-CM | POA: Insufficient documentation

## 2014-01-24 DIAGNOSIS — Z7952 Long term (current) use of systemic steroids: Secondary | ICD-10-CM | POA: Diagnosis not present

## 2014-01-24 DIAGNOSIS — F419 Anxiety disorder, unspecified: Secondary | ICD-10-CM | POA: Insufficient documentation

## 2014-01-24 MED ORDER — LORAZEPAM 1 MG PO TABS: 1.0000 mg | ORAL_TABLET | Freq: Three times a day (TID) | ORAL | Status: DC | PRN

## 2014-01-24 MED ORDER — LORAZEPAM 1 MG PO TABS
1.0000 mg | ORAL_TABLET | Freq: Once | ORAL | Status: AC
  Administered 2014-01-24: 1 mg via ORAL
  Filled 2014-01-24: qty 2

## 2014-01-24 NOTE — ED Notes (Signed)
Pt has anxiety and has stopped taking meds. Thinks she has brain tumor due to intermittent headaches. Reports she is hypochondriac.

## 2014-01-24 NOTE — Discharge Instructions (Signed)
Take ativan as needed for anxiety. Follow up with the recommended doctor for further evaluation.

## 2014-01-24 NOTE — ED Provider Notes (Signed)
CSN: [id number]     Arrival date & time 01/24/14  1051 History  This chart was scribed for non-physician practitioner Emilia BeckKaitlyn Shakerra Red, PA-C, working with Linwood DibblesJon Knapp, MD by Littie Deedsichard Sun, ED Scribe. This patient was seen in room TR11C/TR11C and the patient's care was started at 12:02 PM.      Chief Complaint  Patient presents with  . Anxiety    Patient is a 28 y.o. female presenting with anxiety. The history is provided by the patient. No language interpreter was used.  Anxiety This is a chronic problem. The current episode started more than 2 days ago. The problem has not changed since onset.Associated symptoms include headaches. Nothing aggravates the symptoms. Nothing relieves the symptoms. She has tried nothing for the symptoms.   HPI Comments: Brandi Lucero is a 28 y.o. female with a hx of anxiety and depression who presents to the Emergency Department complaining of anxiety over the past few days, including an anxiety attack that occurred this morning. She has been worried about having a brain tumor because she started having intermittent HAs and sudden pain in the back of her head. Patient has stopped taking her medications, including Seroquel, 6 months ago because she states she felt better and the medicines made her feel tired.  She denies any vision changes.  No PCP per patient  Past Medical History  Diagnosis Date  . IBS (irritable bowel syndrome) 2009  . Ectopic pregnancy   . Anxiety   . Depression    Past Surgical History  Procedure Laterality Date  . Laparoscopy for ectopic pregnancy    . Wisdom tooth extraction     History reviewed. No pertinent family history. History  Substance Use Topics  . Smoking status: Former Smoker -- 0.50 packs/day    Types: Cigarettes  . Smokeless tobacco: Not on file  . Alcohol Use: No   OB History    Gravida Para Term Preterm AB TAB SAB Ectopic Multiple Living   3    2  1 1        Review of Systems  Eyes: Negative for visual  disturbance.  Neurological: Positive for headaches.  Psychiatric/Behavioral: The patient is nervous/anxious.   All other systems reviewed and are negative.     Allergies  Review of patient's allergies indicates no known allergies.  Home Medications   Prior to Admission medications   Medication Sig Start Date End Date Taking? Authorizing Provider  acetaminophen (TYLENOL) 500 MG tablet Take 500 mg by mouth every 6 (six) hours as needed for pain.    Historical Provider, MD  fluticasone (CUTIVATE) 0.05 % cream Apply 1 application topically 2 (two) times daily as needed (nickel rash).     Historical Provider, MD  ondansetron (ZOFRAN) 4 MG tablet Take 4 mg by mouth every 8 (eight) hours as needed for nausea.    Historical Provider, MD  Prenatal Vit-Fe Fumarate-FA (PRENATAL MULTIVITAMIN) TABS Take 1 tablet by mouth daily at 12 noon.    Historical Provider, MD  promethazine (PHENERGAN) 25 MG tablet Take 1 tablet (25 mg total) by mouth every 6 (six) hours as needed for nausea (1/2- 1 po q 4-6 hrs prn vomiting). 05/30/12   Rodell PernaKathy M Harris, NP   BP 137/80 mmHg  Pulse 99  Temp(Src) 98.1 F (36.7 C) (Oral)  Resp 18  SpO2 100%  LMP 01/05/2014 Physical Exam  Constitutional: She is oriented to person, place, and time. She appears well-developed and well-nourished. No distress.  Patient is anxious.  HENT:  Head: Normocephalic and atraumatic.  Mouth/Throat: Oropharynx is clear and moist. No oropharyngeal exudate.  Eyes: Pupils are equal, round, and reactive to light.  Neck: Neck supple.  Cardiovascular: Normal rate.   Pulmonary/Chest: Effort normal.  Abdominal: Soft. She exhibits no distension. There is no tenderness.  Musculoskeletal: She exhibits no edema.  Neurological: She is alert and oriented to person, place, and time. No cranial nerve deficit.  Skin: Skin is warm and dry. No rash noted.  Psychiatric: Her behavior is normal. Her mood appears anxious.  Nursing note and vitals  reviewed.   ED Course  Procedures  DIAGNOSTIC STUDIES: Oxygen Saturation is 100% on room air, normal by my interpretation.    COORDINATION OF CARE: 12:06 PM-Discussed treatment plan which includes medication with pt at bedside and pt agreed to plan.    Labs Review Labs Reviewed - No data to display  Imaging Review No results found.   EKG Interpretation None      MDM   Final diagnoses:  Anxiety    12:26 PM Patient will have ativan for anxiety. Patient will be referred to PCP for further evaluation of anxiety. Vitals stable and patient afebrile.   I personally performed the services described in this documentation, which was scribed in my presence. The recorded information has been reviewed and is accurate.    Emilia BeckKaitlyn Sangita Zani, PA-C 01/24/14 1227  Linwood DibblesJon Knapp, MD 01/25/14 [phone]

## 2014-01-24 NOTE — ED Notes (Signed)
Pt very anxious because she has had a on and off the last few days. States she frequently thinks she has serious illness. Was going to [facility] for treatment. Stopped taking anxiety meds 6 months ago because she felt better.

## 2014-01-30 ENCOUNTER — Emergency Department (HOSPITAL_COMMUNITY)
Admission: EM | Admit: 2014-01-30 | Discharge: 2014-01-30 | Disposition: A | Discharge status: Home / Self Care | Payer: No Typology Code available for payment source | Attending: Emergency Medicine | Admitting: Emergency Medicine

## 2014-01-30 ENCOUNTER — Encounter (HOSPITAL_COMMUNITY): Payer: Self-pay | Admitting: Emergency Medicine

## 2014-01-30 DIAGNOSIS — Z7952 Long term (current) use of systemic steroids: Secondary | ICD-10-CM | POA: Diagnosis not present

## 2014-01-30 DIAGNOSIS — Z8719 Personal history of other diseases of the digestive system: Secondary | ICD-10-CM | POA: Insufficient documentation

## 2014-01-30 DIAGNOSIS — F410 Panic disorder [episodic paroxysmal anxiety] without agoraphobia: Secondary | ICD-10-CM | POA: Diagnosis present

## 2014-01-30 DIAGNOSIS — Z87891 Personal history of nicotine dependence: Secondary | ICD-10-CM | POA: Insufficient documentation

## 2014-01-30 DIAGNOSIS — Z79899 Other long term (current) drug therapy: Secondary | ICD-10-CM | POA: Insufficient documentation

## 2014-01-30 DIAGNOSIS — F419 Anxiety disorder, unspecified: Secondary | ICD-10-CM | POA: Insufficient documentation

## 2014-01-30 DIAGNOSIS — H9201 Otalgia, right ear: Secondary | ICD-10-CM | POA: Diagnosis not present

## 2014-01-30 DIAGNOSIS — J069 Acute upper respiratory infection, unspecified: Secondary | ICD-10-CM | POA: Diagnosis not present

## 2014-01-30 MED ORDER — LORAZEPAM 1 MG PO TABS: 1.0000 mg | ORAL_TABLET | Freq: Three times a day (TID) | ORAL | Status: DC | PRN

## 2014-01-30 MED ORDER — SALINE SPRAY 0.65 % NA SOLN: 1.0000 | NASAL | Status: DC | PRN

## 2014-01-30 NOTE — Discharge Instructions (Signed)
Generalized Anxiety Disorder Generalized anxiety disorder (GAD) is a mental disorder. It interferes with life functions, including relationships, work, and school. GAD is different from normal anxiety, which everyone experiences at some point in their lives in response to specific life events and activities. Normal anxiety actually helps [place] prepare for and get through these life events and activities. Normal anxiety goes away after the event or activity is over.  GAD causes anxiety that is not necessarily related to specific events or activities. It also causes excess anxiety in proportion to specific events or activities. The anxiety associated with GAD is also difficult to control. GAD can vary from mild to severe. People with severe GAD can have intense waves of anxiety with physical symptoms (panic attacks).  SYMPTOMS The anxiety and worry associated with GAD are difficult to control. This anxiety and worry are related to many life events and activities and also occur more days than not for 6 months or longer. People with GAD also have three or more of the following symptoms (one or more in children):  Restlessness.   Fatigue.  Difficulty concentrating.   Irritability.  Muscle tension.  Difficulty sleeping or unsatisfying sleep. DIAGNOSIS GAD is diagnosed through an assessment by your health care provider. Your health care provider will ask you questions aboutyour mood,physical symptoms, and events in your life. Your health care provider may ask you about your medical history and use of alcohol or drugs, including prescription medicines. Your health care provider may also do a physical exam and blood tests. Certain medical conditions and the use of certain substances can cause symptoms similar to those associated with GAD. Your health care provider may refer you to a mental health specialist for further evaluation. TREATMENT The following therapies are usually used to treat GAD:    Medication. Antidepressant medication usually is prescribed for long-term daily control. Antianxiety medicines may be added in severe cases, especially when panic attacks occur.   Talk therapy (psychotherapy). Certain types of talk therapy can be helpful in treating GAD by providing support, education, and guidance. A form of talk therapy called cognitive behavioral therapy can teach you healthy ways to think about and react to daily life events and activities.  Stress managementtechniques. These include yoga, meditation, and exercise and can be very helpful when they are practiced regularly. A mental health specialist can help determine which treatment is best for you. Some people see improvement with one therapy. However, other people require a combination of therapies. Document Released: 06/14/2012 Document Revised: 07/04/2013 Document Reviewed: 06/14/2012 [facility] Patient Information 2015 [place], [place]. This information is not intended to replace advice given to you by your health care provider. Make sure you discuss any questions you have with your health care provider. Upper Respiratory Infection, Adult An upper respiratory infection (URI) is also sometimes known as the common cold. The upper respiratory tract includes the nose, sinuses, throat, trachea, and bronchi. Bronchi are the airways leading to the lungs. Most people improve within 1 week, but symptoms can last up to 2 weeks. A residual cough may last even longer.  CAUSES Many different viruses can infect the tissues lining the upper respiratory tract. The tissues become irritated and inflamed and often become very moist. Mucus production is also common. A cold is contagious. You can easily spread the virus to others by oral contact. This includes kissing, sharing a glass, coughing, or sneezing. Touching your mouth or nose and then touching a surface, which is then touched by another person, can  also spread the virus. SYMPTOMS   Symptoms typically develop 1 to 3 days after you come in contact with a cold virus. Symptoms vary from person to person. They may include:  Runny nose.  Sneezing.  Nasal congestion.  Sinus irritation.  Sore throat.  Loss of voice (laryngitis).  Cough.  Fatigue.  Muscle aches.  Loss of appetite.  Headache.  Low-grade fever. DIAGNOSIS  You might diagnose your own cold based on familiar symptoms, since most people get a cold 2 to 3 times a year. Your caregiver can confirm this based on your exam. Most importantly, your caregiver can check that your symptoms are not due to another disease such as strep throat, sinusitis, pneumonia, asthma, or epiglottitis. Blood tests, throat tests, and X-rays are not necessary to diagnose a common cold, but they may sometimes be helpful in excluding other more serious diseases. Your caregiver will decide if any further tests are required. RISKS AND COMPLICATIONS  You may be at risk for a more severe case of the common cold if you smoke cigarettes, have chronic heart disease (such as heart failure) or lung disease (such as asthma), or if you have a weakened immune system. The very young and very old are also at risk for more serious infections. Bacterial sinusitis, middle ear infections, and bacterial pneumonia can complicate the common cold. The common cold can worsen asthma and chronic obstructive pulmonary disease (COPD). Sometimes, these complications can require emergency medical care and may be life-threatening. PREVENTION  The best way to protect against getting a cold is to practice good hygiene. Avoid oral or hand contact with people with cold symptoms. Wash your hands often if contact occurs. There is no clear evidence that vitamin C, vitamin E, echinacea, or exercise reduces the chance of developing a cold. However, it is always recommended to get plenty of rest and practice good nutrition. TREATMENT  Treatment is directed at relieving  symptoms. There is no cure. Antibiotics are not effective, because the infection is caused by a virus, not by bacteria. Treatment may include:  Increased fluid intake. Sports drinks offer valuable electrolytes, sugars, and fluids.  Breathing heated mist or steam (vaporizer or shower).  Eating chicken soup or other clear broths, and maintaining good nutrition.  Getting plenty of rest.  Using gargles or lozenges for comfort.  Controlling fevers with ibuprofen or acetaminophen as directed by your caregiver.  Increasing usage of your inhaler if you have asthma. Zinc gel and zinc lozenges, taken in the first 24 hours of the common cold, can shorten the duration and lessen the severity of symptoms. Pain medicines may help with fever, muscle aches, and throat pain. A variety of non-prescription medicines are available to treat congestion and runny nose. Your caregiver can make recommendations and may suggest nasal or lung inhalers for other symptoms.  HOME CARE INSTRUCTIONS   Only take over-the-counter or prescription medicines for pain, discomfort, or fever as directed by your caregiver.  Use a warm mist humidifier or inhale steam from a shower to increase air moisture. This may keep secretions moist and make it easier to breathe.  Drink enough water and fluids to keep your urine clear or pale yellow.  Rest as needed.  Return to work when your temperature has returned to normal or as your caregiver advises. You may need to stay home longer to avoid infecting others. You can also use a face mask and careful hand washing to prevent spread of the virus. SEEK MEDICAL CARE IF:  After the first few days, you feel you are getting worse rather than better.  You need your caregiver's advice about medicines to control symptoms.  You develop chills, worsening shortness of breath, or brown or red sputum. These may be signs of pneumonia.  You develop yellow or brown nasal discharge or pain in the  face, especially when you bend forward. These may be signs of sinusitis.  You develop a fever, swollen neck glands, pain with swallowing, or white areas in the back of your throat. These may be signs of strep throat. SEEK IMMEDIATE MEDICAL CARE IF:   You have a fever.  You develop severe or persistent headache, ear pain, sinus pain, or chest pain.  You develop wheezing, a prolonged cough, cough up blood, or have a change in your usual mucus (if you have chronic lung disease).  You develop sore muscles or a stiff neck. Document Released: 08/13/2000 Document Revised: 05/12/2011 Document Reviewed: 05/25/2013 [facility] Patient Information 2015 [place], [place]. This information is not intended to replace advice given to you by your health care provider. Make sure you discuss any questions you have with your health care provider.  Emergency Department Resource Guide 1) Find a Doctor and Pay Out of Pocket Although you won't have to find out who is covered by your insurance plan, it is a good idea to ask around and get recommendations. You will then need to call the office and see if the doctor you have chosen will accept you as a new patient and what types of options they offer for patients who are self-pay. Some doctors offer discounts or will set up payment plans for their patients who do not have insurance, but you will need to ask so you aren't surprised when you get to your appointment.  2) Contact Your Local Health Department Not all health departments have doctors that can see patients for sick visits, but many do, so it is worth a call to see if yours does. If you don't know where your local health department is, you can check in your phone book. The CDC also has a tool to help you locate your state's health department, and many state websites also have listings of all of their local health departments.  3) Find a Walk-in Clinic If your illness is not likely to be very severe or complicated,  you may want to try a walk in clinic. These are popping up all over the country in pharmacies, drugstores, and shopping centers. They're usually staffed by nurse practitioners or physician assistants that have been trained to treat common illnesses and complaints. They're usually fairly quick and inexpensive. However, if you have serious medical issues or chronic medical problems, these are probably not your best option.  No Primary Care Doctor: - Call Health Connect at  [phone] - they can help you locate a primary care doctor that  accepts your insurance, provides certain services, etc. - Physician Referral Service- [phone]  Chronic Pain Problems: Organization         Address  Phone   Notes  [facility]  [phone] Patients need to be referred by their primary care doctor.   Medication Assistance: Organization         Address  Phone   Notes  [facility] Medication [facility] [address] [place] [place], [place] [address] [phone] --Must be a resident of [place] -- Must have NO insurance coverage whatsoever (no Medicaid/ Medicare, etc.) -- The  pt. MUST have a primary care doctor that directs their care regularly and follows them in the community   [facility]ist  [phone]   [employer]  [phone]) [phone]    Agencies that provide inexpensive medical care: Organization         Address  Phone   Notes  [facility]  [phone]   [facility]    [phone]) [phone]   [facility] [address] [place], [place] [address] [phone]   [facility] [place] [address], [place] [phone]   [facility]    [phone]   [facility]    [phone]   [facility]  [address], [place] Phone:  [phone]) [phone], Fax:  [phone]) [phone] Hours of Operation:  9 am - 6 pm, M-F.  Also accepts Medicaid/Medicare and  self-pay.  [facility]  [address], [place] Phone: [phone](336) [phone], Fax: [phone]. Hours of Operation:  8:30 am - 5:30 pm, M-F.  Also accepts Medicaid and self-pay.  [facility] [place] [address], [place] Phone: [phone](336) [phone]   [facility] [address], [place], [place] [phone], Ext. [phone] Mondays & Thursdays: 7-9 AM.  First 15 patients are seen on a first come, first serve basis.    Medicaid-accepting [facility] Providers:  Organization         Address  Phone   Notes  [facility] [address], [place] [phone]) [phone] Also accepts self-pay patients.  [facility] [address], [place]  [phone](336) [phone]   [facility] [address], [place] [phone]   [facility] [address] [place] [address], [place] [phone]) [phone]   Renaye RakersVeita Bland [address], [place]   [phone]) [phone] Only accepts [place] Access [place] patients after they have their name applied to their card.   Self-Pay (no insurance) in [facility]:  Organization         Address  Phone   Notes  Sickle Cell Patients, [facility] [address], [place] [phone]) [phone]   [facility] [address], [place] [phone]) [phone]   [facility] [place]  [address], [place] [phone]   [facility]/Dr. Osei-Bonsu  [address] [place] [address], [place] or [phone], [place] [phone] Phone number for both [place] and [place] locations is the same.  [facility] [address], [place] [phone]   [facility] [address] [place] [address], [place] or [address] [phone]   [facility] [address], [place] [phone]) [phone], phone;  [phone]) [phone], fax Sees patients 1st and 3rd Saturday of every month.  Must not qualify for public or private insurance (i.e. Medicaid, Medicare, [place] Health Choice, Veterans' Benefits)  Household income should be no more than 200% of the poverty level The clinic cannot treat you if you are pregnant or think you are pregnant  Sexually transmitted diseases are not treated at the clinic.    Dental Care: Organization         Address  Phone  Notes  [facility] [address], [place] [phone] Accepts children up to age 58 who are enrolled in Medicaid or [place] Health Choice; pregnant women with a Medicaid card; and children who have applied for Medicaid or [place] Health Choice, but were declined, whose parents can pay a reduced fee at time of service.  [facility]  [address], [place] [phone] Accepts children up to age 73 who are enrolled in [place] or [place] Health Choice; pregnant women with a Medicaid card; and children who have applied for Medicaid or [place] Health Choice, but were declined, whose parents can pay a reduced fee at time of service.  [facility]  [address] [place], [place] [phone] Patients are seen by appointment only. Walk-ins are not accepted. [facility] will see patients 72 years of age and older. Monday - Tuesday (8am-5pm) Most Wednesdays (8:30-5pm) $30 per visit, cash only  [facility]  [address], [facility] [phone] Patients are seen by appointment only. Walk-ins are not accepted. [facility] will see patients 5 years of age and older. One Wednesday Evening (Monthly: Volunteer Based).  $30 per visit, cash only  [employer]  [phone] for adults; Children under age 25, call Graduate Pediatric Dentistry at [phone]. Children aged 72-14, please call  [phone] to request a pediatric application.  Dental services are provided in all areas of dental care including fillings, crowns and bridges, complete and partial dentures, implants, gum treatment, root canals, and extractions. Preventive care is also provided. Treatment is provided to both adults and children. Patients are selected via a lottery and there is often a waiting list.   [facility] [address], [place]  [phone] [email or web address]   [facility] [address] [place], [place] [phone], Ext. [phone] Second and Fourth Thursday of each month, opens at 6:30 AM; Clinic ends at 9 AM.  Patients are seen on a first-come first-served basis, and a limited number are seen during each clinic.   [facility]  [address] [place], [place] [phone]   Eligibility Requirements You must have lived in [place], [place], or [place] counties for at least the last three months.   You cannot be eligible for state or federal sponsored [place], including [employer], [place], or [employer].   You generally cannot be eligible for healthcare insurance through your employer.    How to apply: Eligibility screenings are held every Tuesday and Wednesday afternoon from 1:00 pm until 4:00 pm. You do not need an appointment for the interview!  [facility] [address], [place], [place] [phone]   [facility] Health Department  [phone]   [facility] Health Department  [phone]   [facility] Health Department  [phone]    Behavioral Health Resources in the Community: Intensive Outpatient Programs Organization         Address  Phone  Notes  [facility] [address] [place], [place] [phone]   [facility] [address] [place], [place] [phone]   [facility] [address] [place], [place]   [phone]   [facility] Mental Health [address],  [place], [place] [phone] or [phone]   Substance Abuse Resources Organization         Address  Phone  Notes  Alcohol and  Drug Services  [phone]   [facility]  [phone]   [facility] [place]  [phone]   Floydene Flock  [phone]   Residential & Outpatient Substance Abuse Program  [phone]   Psychological Services Organization         Address  Phone  Notes  [facility]  [phone]   [facility] Services  [phone]   [facility] Mental Health [address] [place] [phone] or [phone]    Mobile Crisis Teams Organization         Address  Phone  Notes  [facility], Mobile Crisis Care Unit  [phone]   [facility]  [address] [place], [place] [phone]   Doristine Locks [address] [place] [phone]    Self-Help/Support Groups Organization         Address  Phone             Notes  [facility] [place] - variety of support groups  [phone]- I7437963 Call for more information  Narcotics Anonymous (NA), [facility]aring [facility] [address], [employer] [place]  2 meetings at this location   Statistician         Address  Phone  Notes  [facility] [address],    [place]  [phone]   [facility]  [address] [place] [address], [place], [place] [phone]   [facility] [address] [place], [place] [phone] Admissions: 8am-3pm M-F  [facility] [address],    [place], [place] [phone]   [facility] [address] [place], [place], [place] [phone]   [facility] [address],  [place], [place] [phone]   [facility] - Intensive Outpatient [address], [place], [place] [phone]   [facility] (Addiction Recovery  [facility] [address]  [place], [place] [phone] or [phone]   [facility] ([facility]) [address], [place], [place] [phone] Accepts Medicaid  [facility] [address],  [place] [phone] Substance Abuse/Addiction Treatment   [facility] Organization         Address  Phone  Notes  [facility]  [phone]   Angie Fava, PhD [address]vin Knack [place], [place]   [phone] or [phone]   [facility] Behavioral   [address] [place], [place] [phone]   [facility] [address] [place], [place] [phone] Insurance/Medicaid/sponsorship through [facility] [address]                                    [place], [place] [phone] Therapy/tele-psych/case  [facility] [address], [place] [phone]    Dr. Lolly Mustache  [phone]   [facility] [place]  [employer] [facility] Dept. 1) [address], [place] 2) [address], [place] 3)  [address], [place] [phone]  [phone]   [facility] Child Abuse Hotline 769-524-4845 or 619-657-6497 (After Hours)

## 2014-01-30 NOTE — ED Notes (Signed)
Pt. reports panic / anxiety attack this morning , requesting prescription ( Ativan ) for her anxiety - pt. stated  2 tabs left .

## 2014-01-30 NOTE — ED Provider Notes (Signed)
CSN: [id number]     Arrival date & time 01/30/14  1943 History  This chart was scribed for non-physician practitioner, Antony MaduraKelly Ramani Riva, PA-C working with Geoffery Lyonsouglas Delo, MD by Greggory StallionKayla Andersen, ED scribe. This patient was seen in room TR04C/TR04C and the patient's care was started at 8:52 PM.   Chief Complaint  Patient presents with  . Panic Attack   The history is provided by the patient. No language interpreter was used.    HPI Comments: Brandi Lucero is a 28 y.o. female with history of anxiety and bipolar disorder who presents to the Emergency Department complaining of a panic attack that occurred this morning. Pt is requesting a refill of her ativan because she only has 2 tabs left. She was evaluated for her anxiety 5 days ago and was given a short prescription for ativan. Pt was also given PCP referrals and was advised to follow up for further management of her anxiety. She tried calling [facility] but states they keep telling her to call back later in the week because they had no open appointments. Pt states her anxiety attack this morning was due to not being able to get in with a PCP. States she used to be [facility] by [facility] for her anxiety and bipolar disorder but states she stopped going once she got insurance because she thought they didn't take people that had it. Pt states she is also having congestion, rhinorrhea and left ear pain. Denies fever.   Past Medical History  Diagnosis Date  . IBS (irritable bowel syndrome) 2009  . Ectopic pregnancy   . Anxiety   . Depression    Past Surgical History  Procedure Laterality Date  . Laparoscopy for ectopic pregnancy    . Wisdom tooth extraction     No family history on file. History  Substance Use Topics  . Smoking status: Former Smoker -- 0.50 packs/day    Types: Cigarettes  . Smokeless tobacco: Not on file  . Alcohol Use: No   OB History    Gravida Para Term Preterm AB TAB SAB Ectopic Multiple Living   3    2  1 1         Review of Systems  Constitutional: Negative for fever.  HENT: Positive for congestion, ear pain and rhinorrhea.   Psychiatric/Behavioral: The patient is nervous/anxious.   All other systems reviewed and are negative.  Allergies  Review of patient's allergies indicates no known allergies.  Home Medications   Prior to Admission medications   Medication Sig Start Date End Date Taking? Authorizing Provider  acetaminophen (TYLENOL) 500 MG tablet Take 500 mg by mouth every 6 (six) hours as needed for pain.    Historical Provider, MD  fluticasone (CUTIVATE) 0.05 % cream Apply 1 application topically 2 (two) times daily as needed (nickel rash).     Historical Provider, MD  LORazepam (ATIVAN) 1 MG tablet Take 1 tablet (1 mg total) by mouth 3 (three) times daily as needed for anxiety. 01/30/14   Antony MaduraKelly Madalynn Pickelsimer, PA-C  ondansetron (ZOFRAN) 4 MG tablet Take 4 mg by mouth every 8 (eight) hours as needed for nausea.    Historical Provider, MD  Prenatal Vit-Fe Fumarate-FA (PRENATAL MULTIVITAMIN) TABS Take 1 tablet by mouth daily at 12 noon.    Historical Provider, MD  promethazine (PHENERGAN) 25 MG tablet Take 1 tablet (25 mg total) by mouth every 6 (six) hours as needed for nausea (1/2- 1 po q 4-6 hrs prn vomiting). 05/30/12  Rodell PernaKathy M Harris, NP  sodium chloride (OCEAN) 0.65 % SOLN nasal spray Place 1 spray into both nostrils as needed for congestion. 01/30/14   Antony MaduraKelly Adelaido Nicklaus, PA-C   BP 123/81 mmHg  Pulse 99  Temp(Src) 98.1 F (36.7 C) (Oral)  Resp 16  Ht 5' (1.524 m)  Wt 153 lb (69.4 kg)  BMI 29.88 kg/m2  SpO2 100%  LMP 01/05/2014   Physical Exam  Constitutional: She is oriented to person, place, and time. She appears well-developed and well-nourished. No distress.  Nontoxic/nonseptic appearing  HENT:  Head: Normocephalic and atraumatic.  Right Ear: External ear and ear canal normal. A middle ear effusion is present.  Left Ear: Tympanic membrane, external ear and ear canal normal.   Mouth/Throat: Uvula is midline, oropharynx is clear and moist and mucous membranes are normal.  Audible nasal congestion  Eyes: Conjunctivae and EOM are normal. Pupils are equal, round, and reactive to light. No scleral icterus.  Neck: Normal range of motion.  Pulmonary/Chest: Effort normal. No respiratory distress.  Respirations even and unlabored  Musculoskeletal: Normal range of motion.  Neurological: She is alert and oriented to person, place, and time. She exhibits normal muscle tone. Coordination normal.  Skin: Skin is warm and dry. No rash noted. She is not diaphoretic. No erythema. No pallor.  Psychiatric: Thought content normal. Her mood appears anxious. Her speech is rapid and/or pressured.  Nursing note and vitals reviewed.   ED Course  Procedures (including critical care time)  DIAGNOSTIC STUDIES: Oxygen Saturation is 100% on RA, normal by my interpretation.    COORDINATION OF CARE: 8:58 PM-Discussed treatment plan which includes a short course of ativan and a saline nasal spray with pt at bedside and pt agreed to plan. Will give pt mental health resources and advised her to follow up.   Labs Review Labs Reviewed - No data to display  Imaging Review No results found.   EKG Interpretation None      MDM   Final diagnoses:  Anxiety  Viral URI    28 year old female with a history of anxiety presents to the emergency department requesting a refill of her anxiety medication. She states that she has been taking Ativan which has been helping her anxiety symptoms recently. She states that she has tried to follow up with a primary care provider, but cannot find anyone who will take new patients with her insurance. Per tablet quantity and instructions on how often to take, patient has been compliant with her Ativan Rx without evidence of overuse. I believe it is safe to fill this patient's Rx in the ED, but have stressed that the ED does not manage chronic medical problems.  Will provide resource guide at d/c and have recommended that patient attempt to f[facility]u with [facility] where she was [facility]. Return precautions discussed and provided. Patient agreeable to plan with no unaddressed concerns. Patient discharged in good condition.  I personally performed the services described in this documentation, which was scribed in my presence. The recorded information has been reviewed and is accurate.   Filed Vitals:   01/30/14 2030  BP: 123/81  Pulse: 99  Temp: 98.1 F (36.7 C)  TempSrc: Oral  Resp: 16  Height: 5' (1.524 m)  Weight: 153 lb (69.4 kg)  SpO2: 100%     Antony MaduraKelly Damein Gaunce, PA-C 01/30/14 2236  Geoffery Lyonsouglas Delo, MD 01/31/14 [phone]

## 2014-02-07 ENCOUNTER — Emergency Department (HOSPITAL_COMMUNITY)
Admission: EM | Admit: 2014-02-07 | Discharge: 2014-02-07 | Disposition: A | Discharge status: Home / Self Care | Payer: No Typology Code available for payment source | Attending: Emergency Medicine | Admitting: Emergency Medicine

## 2014-02-07 ENCOUNTER — Encounter (HOSPITAL_COMMUNITY): Payer: Self-pay | Admitting: Emergency Medicine

## 2014-02-07 DIAGNOSIS — H578 Other specified disorders of eye and adnexa: Secondary | ICD-10-CM | POA: Insufficient documentation

## 2014-02-07 DIAGNOSIS — Z79899 Other long term (current) drug therapy: Secondary | ICD-10-CM | POA: Insufficient documentation

## 2014-02-07 DIAGNOSIS — Z9109 Other allergy status, other than to drugs and biological substances: Secondary | ICD-10-CM

## 2014-02-07 DIAGNOSIS — F419 Anxiety disorder, unspecified: Secondary | ICD-10-CM | POA: Insufficient documentation

## 2014-02-07 DIAGNOSIS — Z8719 Personal history of other diseases of the digestive system: Secondary | ICD-10-CM | POA: Diagnosis not present

## 2014-02-07 DIAGNOSIS — R51 Headache: Secondary | ICD-10-CM | POA: Diagnosis not present

## 2014-02-07 DIAGNOSIS — J302 Other seasonal allergic rhinitis: Secondary | ICD-10-CM | POA: Diagnosis not present

## 2014-02-07 DIAGNOSIS — R0981 Nasal congestion: Secondary | ICD-10-CM

## 2014-02-07 DIAGNOSIS — H9201 Otalgia, right ear: Secondary | ICD-10-CM | POA: Diagnosis present

## 2014-02-07 DIAGNOSIS — H6991 Unspecified Eustachian tube disorder, right ear: Secondary | ICD-10-CM | POA: Insufficient documentation

## 2014-02-07 DIAGNOSIS — Z87891 Personal history of nicotine dependence: Secondary | ICD-10-CM | POA: Insufficient documentation

## 2014-02-07 DIAGNOSIS — H6521 Chronic serous otitis media, right ear: Secondary | ICD-10-CM | POA: Insufficient documentation

## 2014-02-07 DIAGNOSIS — H6981 Other specified disorders of Eustachian tube, right ear: Secondary | ICD-10-CM

## 2014-02-07 MED ORDER — SODIUM CHLORIDE-SODIUM BICARB 2300-700 MG NA KIT: 1.0000 "application " | PACK | Freq: Three times a day (TID) | NASAL | Status: DC | PRN

## 2014-02-07 MED ORDER — FEXOFENADINE HCL 60 MG PO TABS: 180.0000 mg | ORAL_TABLET | Freq: Every day | ORAL | Status: DC

## 2014-02-07 MED ORDER — IBUPROFEN 400 MG PO TABS
400.0000 mg | ORAL_TABLET | Freq: Once | ORAL | Status: AC
  Administered 2014-02-07: 400 mg via ORAL
  Filled 2014-02-07: qty 1

## 2014-02-07 MED ORDER — FLUTICASONE PROPIONATE 50 MCG/ACT NA SUSP: 2.0000 | Freq: Every day | NASAL | Status: DC

## 2014-02-07 NOTE — ED Provider Notes (Signed)
CSN: [id number]     Arrival date & time 02/07/14  1808 History  This chart was scribed for non-physician practitioner, Zacarias Pontes, PA-C working with Artis Delay, MD by Einar Pheasant, ED scribe. This patient was seen in room TR10C/TR10C and the patient's care was started at 7:21 PM.    Chief Complaint  Patient presents with  . Otalgia   Patient is a 28 y.o. female presenting with ear pain. The history is provided by the patient. No language interpreter was used.  Otalgia Location:  Right Behind ear:  No abnormality Quality:  Pressure (fullness) Severity:  Mild Onset quality:  Gradual Duration:  2 weeks Timing:  Intermittent Progression:  Unchanged Chronicity:  New Context comment:  Season change Relieved by:  Nothing Worsened by:  Nothing tried Ineffective treatments:  OTC medications Associated symptoms: congestion, ear discharge (clear, minimal) and headaches   Associated symptoms: no abdominal pain, no cough, no diarrhea, no fever, no hearing loss, no neck pain, no rash, no rhinorrhea, no sore throat, no tinnitus and no vomiting    HPI Comments: Brandi Lucero is a 28 y.o. female with a PMHx of seasonal allergies, who presents to the Emergency Department complaining of gradual onset right sided ear pressure that started approximately 2 weeks ago. She states that the ear pain has resolved but she is experiencing some fullness in her right ear. She describes it as "being under water". Pt reports intermittent frontal headaches and nasal congestion with clear rhinorrhea and postnasal drip. She reports taking a saline spray and taking tylenol to relieve her headache with full resolution. Pt also reports taking Claritin x1 wk with no relief of her symptoms. Denies any fevers, chills, CP, SOB, cough, wheezing, ear drainage, nausea, emesis, abdominal pain, sore throat, hearing loss, tinnitus, vertigo, or focal neuro deficits.   Past Medical History  Diagnosis Date  . IBS  (irritable bowel syndrome) 2009  . Ectopic pregnancy   . Anxiety   . Depression    Past Surgical History  Procedure Laterality Date  . Laparoscopy for ectopic pregnancy    . Wisdom tooth extraction     History reviewed. No pertinent family history. History  Substance Use Topics  . Smoking status: Former Smoker -- 0.50 packs/day    Types: Cigarettes  . Smokeless tobacco: Not on file  . Alcohol Use: No   OB History    Gravida Para Term Preterm AB TAB SAB Ectopic Multiple Living   _0 Review of Systems  Constitutional: Negative for fever and chills.  HENT: Positive for congestion, ear discharge (clear, minimal), ear pain, postnasal drip and sinus pressure. Negative for dental problem, drooling, hearing loss, rhinorrhea, sore throat, tinnitus and trouble swallowing.   Eyes: Positive for discharge (watery) and itching. Negative for pain, redness and visual disturbance.  Respiratory: Negative for cough, shortness of breath and wheezing.   Cardiovascular: Negative for chest pain.  Gastrointestinal: Negative for nausea, vomiting, abdominal pain, diarrhea and constipation.  Musculoskeletal: Negative for myalgias, back pain, arthralgias and neck pain.  Skin: Negative for rash.  Allergic/Immunologic: Positive for environmental allergies.  Neurological: Positive for headaches. Negative for dizziness, weakness, light-headedness and numbness.  Hematological: Negative for adenopathy.  10 Systems reviewed and all are negative for acute change except as noted in the HPI.     Allergies  Review of patient's allergies indicates no known allergies.  Home Medications   Prior to Admission medications  Medication Sig Start Date End Date Taking? Authorizing Provider  acetaminophen (TYLENOL) 500 MG tablet Take 500 mg by mouth every 6 (six) hours as needed for pain.    Historical Provider, MD  fluticasone (CUTIVATE) 0.05 % cream Apply 1 application topically 2 (two) times daily as  needed (nickel rash).     Historical Provider, MD  LORazepam (ATIVAN) 1 MG tablet Take 1 tablet (1 mg total) by mouth 3 (three) times daily as needed for anxiety. 01/30/14   Antonietta Breach, PA-C  ondansetron (ZOFRAN) 4 MG tablet Take 4 mg by mouth every 8 (eight) hours as needed for nausea.    Historical Provider, MD  Prenatal Vit-Fe Fumarate-FA (PRENATAL MULTIVITAMIN) TABS Take 1 tablet by mouth daily at 12 noon.    Historical Provider, MD  promethazine (PHENERGAN) 25 MG tablet Take 1 tablet (25 mg total) by mouth every 6 (six) hours as needed for nausea (1/2- 1 po q 4-6 hrs prn vomiting). 05/30/12   Elesa Massed, NP  sodium chloride (OCEAN) 0.65 % SOLN nasal spray Place 1 spray into both nostrils as needed for congestion. 01/30/14   Antonietta Breach, PA-C   BP 116/68 mmHg  Pulse 86  Temp(Src) 98.7 F (37.1 C)  Resp 18  SpO2 96%  LMP 01/05/2014  Physical Exam  Constitutional: She is oriented to person, place, and time. Vital signs are normal. She appears well-developed and well-nourished.  Non-toxic appearance. No distress.  Afebrile, nontoxic, NAD  HENT:  Head: Normocephalic and atraumatic.  Right Ear: Hearing, external ear and ear canal normal. No drainage, swelling or tenderness. No foreign bodies. No mastoid tenderness. Tympanic membrane is not injected, not perforated, not erythematous and not bulging. A middle ear effusion (serous) is present. No decreased hearing is noted.  Left Ear: Hearing, external ear and ear canal normal. No drainage, swelling or tenderness. No foreign bodies. No mastoid tenderness. Tympanic membrane is not injected, not perforated, not erythematous and not bulging.  No middle ear effusion. No decreased hearing is noted.  Nose: Mucosal edema and rhinorrhea present.  Mouth/Throat: Uvula is midline, oropharynx is clear and moist and mucous membranes are normal. No trismus in the jaw. No uvula swelling.  R ear with serous mid ear effusion, TM intact without injection or  erythema, nonbulging or retracted. Hearing WNL bilaterally. Ext ear canal without drainage or swelling bilaterally B/l nasal turbinates edematous and erythematous with clear rhinorrhea Clear oropharynx, 1+ hypertrophied tonsils bilaterally without erythema or exudates  Eyes: Conjunctivae and EOM are normal. Pupils are equal, round, and reactive to light. Right eye exhibits no discharge. Left eye exhibits discharge (watery).  Watery drainage from L eye  Neck: Normal range of motion. Neck supple.  Cardiovascular: Normal rate and intact distal pulses.   Pulmonary/Chest: Effort normal. No respiratory distress.  Abdominal: Normal appearance. She exhibits no distension.  Musculoskeletal: Normal range of motion.  Lymphadenopathy:       Head (right side): No submandibular and no tonsillar adenopathy present.       Head (left side): No submandibular and no tonsillar adenopathy present.    She has no cervical adenopathy.  No head/neck LAD  Neurological: She is alert and oriented to person, place, and time. She has normal strength. No sensory deficit.  Skin: Skin is warm, dry and intact. No rash noted.  Psychiatric: She has a normal mood and affect. Her behavior is normal.  Nursing note and vitals reviewed.   ED Course  Procedures (including critical care time)  DIAGNOSTIC  STUDIES: Oxygen Saturation is 96% on RA, adequate by my interpretation.    COORDINATION OF CARE: 7:28 PM- Will prescribe Flonase, natty pot, and allegra. Pt advised of plan for treatment and pt agrees.  Labs Review Labs Reviewed - No data to display  Imaging Review No results found.   EKG Interpretation None      MDM   Final diagnoses:  Nasal congestion  Other seasonal allergic rhinitis  Eustachian tube dysfunction, right  Environmental allergies  Right chronic serous otitis media    28 y.o. female here with nasal congestion and allergy symptoms, complaining of pressure in R eye. Serous effusion c/w  eustachian tube dysfunction. Discussed use of netipot and flonase as well as allegra daily to help with her allergies and symptoms. Discussed f/up in 1-2 wks with PCP who could eval her need for ENT referral but doubt ENT referral is needed at this time given that conservative measures have not yet been tried. I explained the diagnosis and have given explicit precautions to return to the ER including for any other new or worsening symptoms. The patient understands and accepts the medical plan as it's been dictated and I have answered their questions. Discharge instructions concerning home care and prescriptions have been given. The patient is STABLE and is discharged to home in good condition.   I personally performed the services described in this documentation, which was scribed in my presence. The recorded information has been reviewed and is accurate.  BP 116/68 mmHg  Pulse 86  Temp(Src) 98.7 F (37.1 C)  Resp 18  SpO2 96%  LMP 01/05/2014  Meds ordered this encounter  Medications  . ibuprofen (ADVIL,MOTRIN) tablet 400 mg    Sig:   . fluticasone (FLONASE) 50 MCG/ACT nasal spray    Sig: Place 2 sprays into both nostrils daily.    Dispense:  16 g    Refill:  0    Order Specific Question:  Supervising Provider    Answer:  Noemi Chapel D [[id number]]  . Sodium Chloride-Sodium Bicarb (NETI POT SINUS WASH) 2300-700 MG KIT    Sig: Place 1 application into the nose 3 (three) times daily as needed (sinus congestion).    Dispense:  1 each    Refill:  2    Order Specific Question:  Supervising Provider    Answer:  Noemi Chapel D [[id number]]  . fexofenadine (ALLEGRA) 60 MG tablet    Sig: Take 3 tablets (180 mg total) by mouth daily.    Dispense:  90 tablet    Refill:  2    Order Specific Question:  Supervising Provider    Answer:  Johnna Acosta [address] [place], PA-C 02/07/14 2030  Artis Delay, MD 02/10/14 1550

## 2014-02-07 NOTE — Discharge Instructions (Signed)
Continue to stay well-hydrated. Continue to alternate between Tylenol and Ibuprofen for pain or fever. Use netipot and flonase to help with nasal congestion and ear pressure. Use allegra or other antihistamine to decrease secretions and for watery itchy eyes, as well as to help with your ear pressure. Followup with your primary care doctor in 1-2 weeks for recheck of ongoing symptoms, and to evaluate the need for a referral to an ear nose and throat doctor. Return to emergency department for emergent changing or worsening of symptoms.   Allergic Rhinitis Allergic rhinitis is when the mucous membranes in the nose respond to allergens. Allergens are particles in the air that cause your body to have an allergic reaction. This causes you to release allergic antibodies. Through a chain of events, these eventually cause you to release histamine into the blood stream. Although meant to protect the body, it is this release of histamine that causes your discomfort, such as frequent sneezing, congestion, and an itchy, runny nose.  CAUSES  Seasonal allergic rhinitis (hay fever) is caused by pollen allergens that may come from grasses, trees, and weeds. Year-round allergic rhinitis (perennial allergic rhinitis) is caused by allergens such as house dust mites, pet dander, and mold spores.  SYMPTOMS   Nasal stuffiness (congestion).  Itchy, runny nose with sneezing and tearing of the eyes. DIAGNOSIS  Your health care provider can help you determine the allergen or allergens that trigger your symptoms. If you and your health care provider are unable to determine the allergen, skin or blood testing may be used. TREATMENT  Allergic rhinitis does not have a cure, but it can be controlled by:  Medicines and allergy shots (immunotherapy).  Avoiding the allergen. Hay fever may often be treated with antihistamines in pill or nasal spray forms. Antihistamines block the effects of histamine. There are over-the-counter  medicines that may help with nasal congestion and swelling around the eyes. Check with your health care provider before taking or giving this medicine.  If avoiding the allergen or the medicine prescribed do not work, there are many new medicines your health care provider can prescribe. Stronger medicine may be used if initial measures are ineffective. Desensitizing injections can be used if medicine and avoidance does not work. Desensitization is when a patient is given ongoing shots until the body becomes less sensitive to the allergen. Make sure you follow up with your health care provider if problems continue. HOME CARE INSTRUCTIONS It is not possible to completely avoid allergens, but you can reduce your symptoms by taking steps to limit your exposure to them. It helps to know exactly what you are allergic to so that you can avoid your specific triggers. SEEK MEDICAL CARE IF:   You have a fever.  You develop a cough that does not stop easily (persistent).  You have shortness of breath.  You start wheezing.  Symptoms interfere with normal daily activities. Document Released: 11/12/2000 Document Revised: 02/22/2013 Document Reviewed: 10/25/2012 [facility] Patient Information 2015 [place], [place]. This information is not intended to replace advice given to you by your health care provider. Make sure you discuss any questions you have with your health care provider.  Allergies Allergies may happen from anything your body is sensitive to. This may be food, medicines, pollens, chemicals, and nearly anything around you in everyday life that produces allergens. An allergen is anything that causes an allergy producing substance. Heredity is often a factor in causing these problems. This means you may have some of the same  allergies as your parents. Food allergies happen in all age groups. Food allergies are some of the most severe and life threatening. Some common food allergies are cow's milk,  seafood, eggs, nuts, wheat, and soybeans. SYMPTOMS   Swelling around the mouth.  An itchy red rash or hives.  Vomiting or diarrhea.  Difficulty breathing. SEVERE ALLERGIC REACTIONS ARE LIFE-THREATENING. This reaction is called anaphylaxis. It can cause the mouth and throat to swell and cause difficulty with breathing and swallowing. In severe reactions only a trace amount of food (for example, peanut oil in a salad) may cause death within seconds. Seasonal allergies occur in all age groups. These are seasonal because they usually occur during the same season every year. They may be a reaction to molds, grass pollens, or tree pollens. Other causes of problems are house dust mite allergens, pet dander, and mold spores. The symptoms often consist of nasal congestion, a runny itchy nose associated with sneezing, and tearing itchy eyes. There is often an associated itching of the mouth and ears. The problems happen when you come in contact with pollens and other allergens. Allergens are the particles in the air that the body reacts to with an allergic reaction. This causes you to release allergic antibodies. Through a chain of events, these eventually cause you to release histamine into the blood stream. Although it is meant to be protective to the body, it is this release that causes your discomfort. This is why you were given anti-histamines to feel better. If you are unable to pinpoint the offending allergen, it may be determined by skin or blood testing. Allergies cannot be cured but can be controlled with medicine. Hay fever is a collection of all or some of the seasonal allergy problems. It may often be treated with simple over-the-counter medicine such as diphenhydramine. Take medicine as directed. Do not drink alcohol or drive while taking this medicine. Check with your caregiver or package insert for child dosages. If these medicines are not effective, there are many new medicines your caregiver  can prescribe. Stronger medicine such as nasal spray, eye drops, and corticosteroids may be used if the first things you try do not work well. Other treatments such as immunotherapy or desensitizing injections can be used if all else fails. Follow up with your caregiver if problems continue. These seasonal allergies are usually not life threatening. They are generally more of a nuisance that can often be handled using medicine. HOME CARE INSTRUCTIONS   If unsure what causes a reaction, keep a diary of foods eaten and symptoms that follow. Avoid foods that cause reactions.  If hives or rash are present:  Take medicine as directed.  You may use an over-the-counter antihistamine (diphenhydramine) for hives and itching as needed.  Apply cold compresses (cloths) to the skin or take baths in cool water. Avoid hot baths or showers. Heat will make a rash and itching worse.  If you are severely allergic:  Following a treatment for a severe reaction, hospitalization is often required for closer follow-up.  Wear a medic-alert bracelet or necklace stating the allergy.  You and your family must learn how to give adrenaline or use an anaphylaxis kit.  If you have had a severe reaction, always carry your anaphylaxis kit or EpiPen with you. Use this medicine as directed by your caregiver if a severe reaction is occurring. Failure to do so could have a fatal outcome. SEEK MEDICAL CARE IF:  You suspect a food allergy. Symptoms generally happen  within 30 minutes of eating a food.  Your symptoms have not gone away within 2 days or are getting worse.  You develop new symptoms.  You want to retest yourself or your child with a food or drink you think causes an allergic reaction. Never do this if an anaphylactic reaction to that food or drink has happened before. Only do this under the care of a caregiver. SEEK IMMEDIATE MEDICAL CARE IF:   You have difficulty breathing, are wheezing, or have a tight  feeling in your chest or throat.  You have a swollen mouth, or you have hives, swelling, or itching all over your body.  You have had a severe reaction that has responded to your anaphylaxis kit or an EpiPen. These reactions may return when the medicine has worn off. These reactions should be considered life threatening. MAKE SURE YOU:   Understand these instructions.  Will watch your condition.  Will get help right away if you are not doing well or get worse. Document Released: 05/13/2002 Document Revised: 06/14/2012 Document Reviewed: 10/18/2007 [facility] Patient Information 2015 [place], [place]. This information is not intended to replace advice given to you by your health care provider. Make sure you discuss any questions you have with your health care provider.  Otitis Media With Effusion Otitis media with effusion is the presence of fluid in the middle ear. This is a common problem in children, which often follows ear infections. It may be present for weeks or longer after the infection. Unlike an acute ear infection, otitis media with effusion refers only to fluid behind the ear drum and not infection. Children with repeated ear and sinus infections and allergy problems are the most likely to get otitis media with effusion. CAUSES  The most frequent cause of the fluid buildup is dysfunction of the eustachian tubes. These are the tubes that drain fluid in the ears to the back of the nose (nasopharynx). SYMPTOMS   The main symptom of this condition is hearing loss. As a result, you or your child may:  Listen to the TV at a loud volume.  Not respond to questions.  Ask "what" often when spoken to.  Mistake or confuse one sound or word for another.  There may be a sensation of fullness or pressure but usually not pain. DIAGNOSIS   Your health care provider will diagnose this condition by examining you or your child's ears.  Your health care provider may test the pressure in you  or your child's ear with a tympanometer.  A hearing test may be conducted if the problem persists. TREATMENT   Treatment depends on the duration and the effects of the effusion.  Antibiotics, decongestants, nose drops, and cortisone-type drugs (tablets or nasal spray) may not be helpful.  Children with persistent ear effusions may have delayed language or behavioral problems. Children at risk for developmental delays in hearing, learning, and speech may require referral to a specialist earlier than children not at risk.  You or your child's health care provider may suggest a referral to an ear, nose, and throat surgeon for treatment. The following may help restore normal hearing:  Drainage of fluid.  Placement of ear tubes (tympanostomy tubes).  Removal of adenoids (adenoidectomy). HOME CARE INSTRUCTIONS   Avoid secondhand smoke.  Infants who are breastfed are less likely to have this condition.  Avoid feeding infants while they are lying flat.  Avoid known environmental allergens.  Avoid people who are sick. SEEK MEDICAL CARE IF:  Hearing is not better in 3 months.  Hearing is worse.  Ear pain.  Drainage from the ear.  Dizziness. MAKE SURE YOU:   Understand these instructions.  Will watch your condition.  Will get help right away if you are not doing well or get worse. Document Released: 03/27/2004 Document Revised: 07/04/2013 Document Reviewed: 09/14/2012 [facility] Patient Information 2015 [place], [place]. This information is not intended to replace advice given to you by your health care provider. Make sure you discuss any questions you have with your health care provider.

## 2014-02-07 NOTE — ED Notes (Signed)
Patient took tylenol at 10am today.  No meds prior to arrival for pain.  Patient has not taken any meds for congestion.  Patient denies fever.

## 2014-02-07 NOTE — ED Notes (Signed)
Pt c/o right ear pain and fullness and trouble hearing with some nasal congestion

## 2014-02-10 ENCOUNTER — Encounter: Payer: Self-pay | Admitting: Internal Medicine

## 2014-02-10 ENCOUNTER — Ambulatory Visit: Payer: No Typology Code available for payment source | Attending: Internal Medicine | Admitting: Internal Medicine

## 2014-02-10 VITALS — BP 133/80 | HR 73 | Temp 98.3°F | Resp 16 | Ht 60.0 in | Wt 146.0 lb

## 2014-02-10 DIAGNOSIS — Z87891 Personal history of nicotine dependence: Secondary | ICD-10-CM | POA: Insufficient documentation

## 2014-02-10 DIAGNOSIS — F419 Anxiety disorder, unspecified: Secondary | ICD-10-CM | POA: Diagnosis not present

## 2014-02-10 DIAGNOSIS — J069 Acute upper respiratory infection, unspecified: Secondary | ICD-10-CM | POA: Diagnosis present

## 2014-02-10 DIAGNOSIS — F3181 Bipolar II disorder: Secondary | ICD-10-CM | POA: Insufficient documentation

## 2014-02-10 DIAGNOSIS — Z7951 Long term (current) use of inhaled steroids: Secondary | ICD-10-CM | POA: Insufficient documentation

## 2014-02-10 MED ORDER — LORAZEPAM 0.5 MG PO TABS
0.5000 mg | ORAL_TABLET | Freq: Two times a day (BID) | ORAL | Status: DC | PRN
Start: 2014-02-10 — End: 2014-02-28

## 2014-02-10 NOTE — Patient Instructions (Signed)

## 2014-02-10 NOTE — Progress Notes (Signed)
Patient ID: Brandi Lucero, female   DOB: Mar 17, 1985, 28 y.o.   MRN: [id number]  KMM:[id number]  BXU:[id number]  DOB - Oct 30, 1985  CC:  Chief Complaint  Patient presents with  . Establish Care       HPI: Brandi Lucero is a 28 y.o. female with a history of IBS, anxiety, and depression here today to establish medical care. She has been having difficulty of pain in her right ear with headaches for the past two weeks. Patient was evaluated in the ER on 12/8 for the same complaints and was given Flonase, netipot, and allegra.  Patient reports that she has used all the medication but continues to have sinus pain and pressure.  She states that she worries about her health often and thinks some of her symptoms may be in her head.   She states that she was diagnosed with Bipolar 2 disorder w/o agoraphobia.  She reports that she has severe anxiety and needs a refill of lorazepam 1 mg that was given to her by the ER.  She was going to [facility] in the past and presents with several medication bottles which include Seroquel, trazodone, wellbutrin, Celexa, zoloft, and vistaril.  She states that she stopped each medication for a specific reason.  She has not been back to see her psychiatrist for the past year.  She reports that she did not like her psychiatrist name so she could not take him serious.     Allergies  Allergen Reactions  . Vicodin [Hydrocodone-Acetaminophen]     Bad headache    Past Medical History  Diagnosis Date  . IBS (irritable bowel syndrome) 2009  . Ectopic pregnancy   . Anxiety   . Depression    Current Outpatient Prescriptions on File Prior to Visit  Medication Sig Dispense Refill  . acetaminophen (TYLENOL) 500 MG tablet Take 500 mg by mouth every 6 (six) hours as needed for pain.    . fexofenadine (ALLEGRA) 60 MG tablet Take 3 tablets (180 mg total) by mouth daily. 90 tablet 2  . fluticasone (FLONASE) 50 MCG/ACT nasal spray Place 2 sprays into both nostrils daily. 16 g 0  .  LORazepam (ATIVAN) 1 MG tablet Take 1 tablet (1 mg total) by mouth 3 (three) times daily as needed for anxiety. 17 tablet 0  . sodium chloride (OCEAN) 0.65 % SOLN nasal spray Place 1 spray into both nostrils as needed for congestion. 1 Bottle 0  . Sodium Chloride-Sodium Bicarb (NETI POT SINUS WASH) 2300-700 MG KIT Place 1 application into the nose 3 (three) times daily as needed (sinus congestion). 1 each 2  . fluticasone (CUTIVATE) 0.05 % cream Apply 1 application topically 2 (two) times daily as needed (nickel rash).     . ondansetron (ZOFRAN) 4 MG tablet Take 4 mg by mouth every 8 (eight) hours as needed for nausea.    . Prenatal Vit-Fe Fumarate-FA (PRENATAL MULTIVITAMIN) TABS Take 1 tablet by mouth daily at 12 noon.    . promethazine (PHENERGAN) 25 MG tablet Take 1 tablet (25 mg total) by mouth every 6 (six) hours as needed for nausea (1/2- 1 po q 4-6 hrs prn vomiting). (Patient not taking: Reported on 02/10/2014) 20 tablet 0   No current facility-administered medications on file prior to visit.   History reviewed. No pertinent family history. History   Social History  . Marital Status: Single    Spouse Name: N/A    Number of Children: N/A  . Years of Education: N/A  Occupational History  . Not on file.   Social History Main Topics  . Smoking status: Former Smoker -- 0.50 packs/day    Types: Cigarettes  . Smokeless tobacco: Not on file  . Alcohol Use: No  . Drug Use: No  . Sexual Activity: Yes    Birth Control/ Protection: None   Other Topics Concern  . Not on file   Social History Narrative    Review of Systems  Constitutional: Negative for fever, chills and diaphoresis.  HENT: Positive for congestion and ear pain. Negative for sore throat.   Eyes: Negative for blurred vision.  Respiratory: Negative.   Cardiovascular: Negative.   Gastrointestinal: Negative.   Genitourinary: Negative.   Musculoskeletal: Negative.   Neurological: Positive for headaches. Negative for  dizziness and tingling.  Psychiatric/Behavioral: Negative for depression and suicidal ideas. The patient is nervous/anxious.       Objective:   Filed Vitals:   02/10/14 1014  BP: 133/80  Pulse: 73  Temp: 98.3 F (36.8 C)  Resp: 16    Physical Exam: Constitutional: Patient appears well-developed and well-nourished. No distress. HENT: Normocephalic, atraumatic, External right and left ear normal. Oropharynx is clear and moist.  Eyes: Conjunctivae and EOM are normal. PERRLA, no scleral icterus. Neck: Normal ROM. Neck supple. No JVD. No tracheal deviation. No thyromegaly. CVS: RRR, S1/S2 +, no murmurs, no gallops, no carotid bruit.  Pulmonary: Effort and breath sounds normal, no stridor, rhonchi, wheezes, rales.   Musculoskeletal: Normal range of motion. No edema and no tenderness.  Lymphadenopathy: No lymphadenopathy noted, cervical, Neuro: Alert. Skin: Skin is warm and dry. No rash noted. Not diaphoretic. No erythema. No pallor. Psychiatric: Normal mood and affect. Behavior, judgment, thought content normal.  Lab Results  Component Value Date   WBC 4.2 04/11/2010   HGB 14.6 05/30/2011   HCT 43.0 05/30/2011   MCV 84.4 04/11/2010   PLT 304 04/11/2010   Lab Results  Component Value Date   CREATININE 0.80 05/30/2011   BUN 15 05/30/2011   NA 138 05/30/2011   K 3.8 05/30/2011   CL 106 05/30/2011    No results found for: HGBA1C Lipid Panel  No results found for: CHOL, TRIG, HDL, CHOLHDL, VLDL, LDLCALC     Assessment and plan:   Brandi Lucero was seen today for establish care.  Diagnoses and associated orders for this visit:  Viral URI Stressed to patient to continue allegra and flonase.  Education provided to patient on the nature of viruses and how it does not require antibiotic use but symptomatic treatment.  Patient will rest, increase fluids, humidifier, avoid smoking/second hand smoke, and perform proper hand hygiene. If patient does not improve within one week they  may return to clinic for further evaluation.   Anxiety - LORazepam (ATIVAN) 0.5 MG tablet; Take 1 tablet (0.5 mg total) by mouth 2 (two) times daily as needed for anxiety. Discussed with patient that anxiety symptoms are her body's response to stress. Discussed the possible need to initiate SSRI. Discussed that benzo's are habit forming and should only be used when symptoms are severe and this will not be a cyclic fill drug.  Will follow up with patient in 4 weeks to determine further needs.   Bipolar 2 disorder Stressed that patient should f/u with psychiatry asap.  Patient will need to begin on proper therapy in order to avoid ER use. Explained that she needs long term maintenance and not just benzo's.    Return if symptoms worsen or fail to  improve.  The patient was given clear instructions to return to medical center if symptoms don't improve, worsen or new problems develop. The patient verbalized understanding. The patient was told to call to get lab results if they haven't heard anything in the next week.     Chari Manning, NP-C [facility] [phone] 02/10/2014, 10:23 AM

## 2014-02-10 NOTE — Progress Notes (Signed)
Pt is here to establish care. Pt has a history of anxiety and depression. Pt reports having pain in her right ear w/ headaches.

## 2014-02-15 ENCOUNTER — Telehealth: Payer: Self-pay | Admitting: Internal Medicine

## 2014-02-15 NOTE — Telephone Encounter (Signed)
Pt. Called to request a medication to treat her anxiety, she would like to speak to a nurse. Please f/u with pt.

## 2014-02-16 NOTE — Telephone Encounter (Signed)
Pt calling to follow up on refill request for LORazepam (ATIVAN) 0.5 MG tablet . She states that has contacted every mental health office she has found in [place] to schedule appt but has been unable to.  She states [facility] is not accepting new patients until February and [facility] not accept her insurance so they require her to pay out of pocket for visits.  Pt is currently out of of her medication.

## 2014-02-17 NOTE — Telephone Encounter (Signed)
No, I have explained this to the patient.  [facility] her insurance I have already verified

## 2014-02-17 NOTE — Telephone Encounter (Signed)
Can we write her script for this?

## 2014-02-20 ENCOUNTER — Telehealth: Payer: Self-pay | Admitting: Emergency Medicine

## 2014-02-20 NOTE — Telephone Encounter (Signed)
Left message for pt to f/u [facility] regarding medication refill Lorazepam

## 2014-03-01 ENCOUNTER — Ambulatory Visit (INDEPENDENT_AMBULATORY_CARE_PROVIDER_SITE_OTHER): Payer: No Typology Code available for payment source | Admitting: Internal Medicine

## 2014-03-01 VITALS — BP 122/82 | HR 81 | Temp 98.4°F | Resp 18 | Ht 62.6 in | Wt 153.6 lb

## 2014-03-01 DIAGNOSIS — F3181 Bipolar II disorder: Secondary | ICD-10-CM

## 2014-03-01 DIAGNOSIS — F419 Anxiety disorder, unspecified: Secondary | ICD-10-CM

## 2014-03-01 DIAGNOSIS — J302 Other seasonal allergic rhinitis: Secondary | ICD-10-CM

## 2014-03-01 DIAGNOSIS — J309 Allergic rhinitis, unspecified: Secondary | ICD-10-CM | POA: Insufficient documentation

## 2014-03-01 MED ORDER — LORAZEPAM 0.5 MG PO TABS: ORAL_TABLET | ORAL | Status: DC

## 2014-03-01 MED ORDER — METHYLPREDNISOLONE (PAK) 4 MG PO TABS: ORAL_TABLET | ORAL | Status: DC

## 2014-03-01 NOTE — Progress Notes (Signed)
Patient ID: Brandi Lucero, female   DOB: October 07, 1985, 28 y.o.   MRN: [id number]    Facility  [facility]    Place of Service:   OFFICE   Allergies  Allergen Reactions  . Vicodin [Hydrocodone-Acetaminophen]     Bad headache     Chief Complaint  Patient presents with  . Establish Care    HPI:   28 yo female presents today as a new patient. She has anxiety attacks and takes prn lorazepam which helps. Has tried multiple psych meds in past which caused her to feel like a zombie.    Medications: Patient's Medications  New Prescriptions   No medications on file  Previous Medications   ACETAMINOPHEN (TYLENOL) 500 MG TABLET    500 mg. Once every 2 weeks   ASPIRIN-CAFFEINE (BC FAST PAIN RELIEF PO)    Take by mouth. As needed for Headache   FEXOFENADINE (ALLEGRA) 60 MG TABLET    Take 3 tablets (180 mg total) by mouth daily.   FLUTICASONE (CUTIVATE) 0.05 % CREAM    Apply 1 application topically 2 (two) times daily as needed (nickel rash).    FLUTICASONE (FLONASE) 50 MCG/ACT NASAL SPRAY    Place 2 sprays into both nostrils daily.   LORAZEPAM (ATIVAN) 1 MG TABLET    Take 0.5 mg by mouth 2 (two) times daily.    PHENYLEPHRINE-APAP-GUAIFENESIN (MUCINEX SINUS-MAX PO)    Take by mouth as needed.   SODIUM CHLORIDE (OCEAN) 0.65 % SOLN NASAL SPRAY    Place 1 spray into both nostrils as needed for congestion.   SODIUM CHLORIDE-SODIUM BICARB (NETI POT SINUS WASH) 2300-700 MG KIT    Place 1 application into the nose 3 (three) times daily as needed (sinus congestion).  Modified Medications   No medications on file  Discontinued Medications   ONDANSETRON (ZOFRAN) 4 MG TABLET    Take 4 mg by mouth every 8 (eight) hours as needed for nausea.   PRENATAL VIT-FE FUMARATE-FA (PRENATAL MULTIVITAMIN) TABS    Take 1 tablet by mouth daily at 12 noon.   PROMETHAZINE (PHENERGAN) 25 MG TABLET    Take 1 tablet (25 mg total) by mouth every 6 (six) hours as needed for nausea (1/2- 1 po q 4-6 hrs prn vomiting).     Review  of Systems  Filed Vitals:   03/01/14 1059  BP: 122/82  Pulse: 81  Temp: 98.4 F (36.9 C)  TempSrc: Oral  Resp: 18  Height: 5' 2.6" (1.59 m)  Weight: 153 lb 9.6 oz (69.673 kg)  SpO2: 97%   Body mass index is 27.56 kg/(m^2).  Physical Exam CONSTITUTIONAL: Looks well in NAD. Awake, alert and oriented x 3. Mother present HEENT:TMs appear nml. PERRLA. Nares with enlarged grey dry turbinates. No sinus TTP. Oropharynx cobblestoning without exudate or redness. NECK: Supple. Nontender. No palpable cervical or supraclavicular lymph nodes. No carotid bruit b/l.  CVS: Regular rate without murmur, gallop or rub. LUNGS: CTA b/l no wheezing, rales or rhonchi. ABDOMEN: Bowel sounds present x 4. Soft, nontender, nondistended. No palpable mass or bruit EXTREMITIES: No edema b/l. Distal pulses palpable. No calf tenderness PSYCH: anxious SKIN: multiple tattoos  Chart reviewed  Assessment/Plan    ICD-9-CM ICD-10-CM   1. Other seasonal allergic rhinitis 477.8 J30.2 methylPREDNIsolone (MEDROL DOSPACK) 4 MG tablet  2. Anxiety 300.00 F41.9 LORazepam (ATIVAN) 0.5 MG tablet  3. Bipolar 2 disorder 296.89 F31.81    - F/u with psychiatry for management of bipolar. Provided her a list of offices.  Discussed that BZD will only be Rx temporally until she sees psych - push fluids and rest. Resume nasal steroid - proper use instructions given. Start OTC antihistamine (claritin) daily   Trent S. Perlie Gold  [facility] [address] [place], [place] [address] [phone] Office (Wednesdays and Fridays 8 AM - 5 PM) [phone] Cell (Monday-Friday 8 AM - 5 PM)

## 2014-03-01 NOTE — Patient Instructions (Signed)
F/u with psychiatry for management of bipolar. Provided her a list of offices

## 2014-03-02 ENCOUNTER — Encounter: Payer: Self-pay | Admitting: Internal Medicine

## 2014-03-27 ENCOUNTER — Encounter (HOSPITAL_COMMUNITY): Payer: Self-pay | Admitting: Emergency Medicine

## 2014-03-27 ENCOUNTER — Emergency Department (INDEPENDENT_AMBULATORY_CARE_PROVIDER_SITE_OTHER)
Admission: EM | Admit: 2014-03-27 | Discharge: 2014-03-27 | Disposition: A | Discharge status: Home / Self Care | Payer: No Typology Code available for payment source | Source: Home / Self Care | Attending: Family Medicine | Admitting: Family Medicine

## 2014-03-27 DIAGNOSIS — Z23 Encounter for immunization: Secondary | ICD-10-CM

## 2014-03-27 DIAGNOSIS — S61319A Laceration without foreign body of unspecified finger with damage to nail, initial encounter: Secondary | ICD-10-CM

## 2014-03-27 MED ORDER — TETANUS-DIPHTH-ACELL PERTUSSIS 5-2.5-18.5 LF-MCG/0.5 IM SUSP
0.5000 mL | Freq: Once | INTRAMUSCULAR | Status: AC
  Administered 2014-03-27: 0.5 mL via INTRAMUSCULAR

## 2014-03-27 MED ORDER — TETANUS-DIPHTH-ACELL PERTUSSIS 5-2.5-18.5 LF-MCG/0.5 IM SUSP
INTRAMUSCULAR | Status: AC
  Filled 2014-03-27: qty 0.5

## 2014-03-27 NOTE — ED Notes (Signed)
Pt sliced the top of her thumb and nail with a knife while cutting up chicken at work about an hour ago.

## 2014-03-27 NOTE — Discharge Instructions (Signed)
Thank you for coming in today. Keep the wound clean and covered with ointment.  Return as needed.    Finger Avulsion  When the tip of the finger is lost, a new nail may grow back if part of the fingernail is left. The new nail may be deformed. If just the tip of the finger is lost, no repair may be needed unless there is bone showing. If bone is showing, your caregiver may need to remove the protruding bone and put on a bandage. Your caregiver will do what is best for you. Most of the time when a fingertip is lost, the end will gradually grow back on and look fairly normal, but it may remain sensitive to pressure and temperature extremes for a long time. HOME CARE INSTRUCTIONS   Keep your hand elevated above your heart to relieve pain and swelling.  Keep your dressing dry and clean.  Change your bandage in 24 hours or as directed.  Only take over-the-counter or prescription medicines for pain, discomfort, or fever as directed by your caregiver.  See your caregiver as needed for problems. SEEK MEDICAL CARE IF:   You have increased pain, swelling, drainage, or bleeding.  You have a fever.  You have swelling that spreads from your finger and into your hand. Make sure to check to see if you need a tetanus booster. Document Released: 04/28/2001 Document Revised: 08/19/2011 Document Reviewed: 03/23/2008 [facility] Patient Information 2015 [place], [place]. This information is not intended to replace advice given to you by your health care provider. Make sure you discuss any questions you have with your health care provider.    Delayed Wound Closure Sometimes, your health care provider will decide to delay closing a wound for several days. This is done when the wound is badly bruised, dirty, or when it has been several hours since the injury happened. By delaying the closure of your wound, the risk of infection is reduced. Wounds that are closed in 3-7 days after being cleaned up and dressed  heal just as well as those that are closed right away. HOME CARE INSTRUCTIONS  Rest and elevate the injured area until the pain and swelling are gone.  Have your wound checked as instructed by your health care provider. SEEK MEDICAL CARE IF:  You develop unusual or increased swelling or redness around the wound.  You have increasing pain or tenderness.  There is increasing fluid (drainage) or a bad smelling drainage coming from the wound. Document Released: 02/17/2005 Document Revised: 02/22/2013 Document Reviewed: 08/17/2012 [facility] Patient Information 2015 [place], [place]. This information is not intended to replace advice given to you by your health care provider. Make sure you discuss any questions you have with your health care provider.

## 2014-03-27 NOTE — ED Provider Notes (Signed)
Brandi Lucero is a 29 y.o. female who presents to Urgent Care today for left thumb laceration. Patient was at work today cutting with a knife when she accidentally cut her left thumb. She suffered a oblique laceration to her left thumbnail. She notes some bleeding. She applied compression would stop the bleeding. She denies any radiating pain weakness or numbness. She cannot recall her last tetanus vaccination.   Past Medical History  Diagnosis Date  . IBS (irritable bowel syndrome) 2009  . Ectopic pregnancy   . Anxiety   . Depression    Past Surgical History  Procedure Laterality Date  . Laparoscopy for ectopic pregnancy  03/03/2004  . Wisdom tooth extraction    . Dilation and evacuation  03/03/2012  . Ectopic pregnancy surgery  03/03/2004   History  Substance Use Topics  . Smoking status: Former Smoker -- 0.50 packs/day    Types: Cigarettes  . Smokeless tobacco: Not on file  . Alcohol Use: 1.2 oz/week    2 Not specified per week   ROS as above Medications: No current facility-administered medications for this encounter.   Current Outpatient Prescriptions  Medication Sig Dispense Refill  . acetaminophen (TYLENOL) 500 MG tablet 500 mg. Once every 2 weeks    . Aspirin-Caffeine (BC FAST PAIN RELIEF PO) Take by mouth. As needed for Headache    . fexofenadine (ALLEGRA) 60 MG tablet Take 3 tablets (180 mg total) by mouth daily. (Patient not taking: Reported on 03/01/2014) 90 tablet 2  . fluticasone (CUTIVATE) 0.05 % cream Apply 1 application topically 2 (two) times daily as needed (nickel rash).     . fluticasone (FLONASE) 50 MCG/ACT nasal spray Place 2 sprays into both nostrils daily. (Patient not taking: Reported on 03/01/2014) 16 g 0  . LORazepam (ATIVAN) 0.5 MG tablet Take 1/2 tablet twice daily 30 tablet 1  . methylPREDNIsolone (MEDROL DOSPACK) 4 MG tablet follow package directions 21 tablet 0  . Phenylephrine-APAP-Guaifenesin (MUCINEX SINUS-MAX PO) Take by mouth as needed.     . sodium chloride (OCEAN) 0.65 % SOLN nasal spray Place 1 spray into both nostrils as needed for congestion. (Patient not taking: Reported on 03/01/2014) 1 Bottle 0  . Sodium Chloride-Sodium Bicarb (NETI POT SINUS WASH) 2300-700 MG KIT Place 1 application into the nose 3 (three) times daily as needed (sinus congestion). 1 each 2   Allergies  Allergen Reactions  . Vicodin [Hydrocodone-Acetaminophen]     Bad headache      Exam:  BP 138/92 mmHg  Pulse 72  Temp(Src) 97.9 F (36.6 C) (Oral)  Resp 16  SpO2 99%  LMP 03/26/2014 (Exact Date) Gen: Well NAD Left thumb: Oblique shallow 1 cm laceration in the left thumbnail. The laceration is fairly extensive the thumb nail and into the distal tissue. Thumb motion Refill and sensation is intact and normal  The wound was cleaned with clean water  No results found for this or any previous visit (from the past 24 hour(s)). No results found.  Assessment and Plan: 29 y.o. female with left thumbnail laceration. Plan for watchful waiting. Will give patient a tetanus vaccination prior to discharge. Return as needed.   Discussed warning signs or symptoms. Please see discharge instructions. Patient expresses understanding.     Gregor Hams, MD 03/27/14 [phone]

## 2014-03-31 ENCOUNTER — Encounter: Payer: Self-pay | Admitting: Internal Medicine

## 2014-03-31 ENCOUNTER — Ambulatory Visit (INDEPENDENT_AMBULATORY_CARE_PROVIDER_SITE_OTHER): Payer: No Typology Code available for payment source | Admitting: Internal Medicine

## 2014-03-31 ENCOUNTER — Ambulatory Visit: Payer: No Typology Code available for payment source | Admitting: Internal Medicine

## 2014-03-31 VITALS — BP 112/68 | HR 95 | Temp 98.5°F | Resp 10 | Wt 151.0 lb

## 2014-03-31 DIAGNOSIS — H109 Unspecified conjunctivitis: Secondary | ICD-10-CM

## 2014-03-31 DIAGNOSIS — J302 Other seasonal allergic rhinitis: Secondary | ICD-10-CM

## 2014-03-31 MED ORDER — TOBRAMYCIN-DEXAMETHASONE 0.3-0.1 % OP SUSP: 1.0000 [drp] | OPHTHALMIC | Status: DC

## 2014-03-31 NOTE — Patient Instructions (Signed)
Wash hands frequently especially when you touch your eye  May return to work on Monday 03/31/14.   RTO if symptoms persist or worsen. Continue allergy medicine as ordered  Keep appointment with Dr Montez Moritaarter as scheduled

## 2014-03-31 NOTE — Progress Notes (Signed)
Patient ID: Brandi Lucero, female   DOB: 05-Apr-1985, 29 y.o.   MRN: [id number]    Facility      Place of Service:      Allergies  Allergen Reactions  . Vicodin [Hydrocodone-Acetaminophen]     Bad headache     Chief Complaint  Patient presents with  . Acute Visit    Pink eye (Right eye)    HPI:  29 yo female seen today for above. She has a hx allergic rhinitis. She noticed green crusting of OD this AM. She also has a scratchy throat.  Medications: Patient's Medications  New Prescriptions   No medications on file  Previous Medications   ACETAMINOPHEN (TYLENOL) 500 MG TABLET    500 mg. Once every 2 weeks   ASPIRIN-CAFFEINE (BC FAST PAIN RELIEF PO)    Take by mouth. As needed for Headache   FEXOFENADINE (ALLEGRA) 60 MG TABLET    Take 3 tablets (180 mg total) by mouth daily.   FLUTICASONE (FLONASE) 50 MCG/ACT NASAL SPRAY    Place 2 sprays into both nostrils daily.   LORAZEPAM (ATIVAN) 0.5 MG TABLET    Take 1/2 tablet twice daily   METHYLPREDNISOLONE (MEDROL DOSPACK) 4 MG TABLET    follow package directions   PHENYLEPHRINE-APAP-GUAIFENESIN (MUCINEX SINUS-MAX PO)    Take by mouth as needed.   SODIUM CHLORIDE (OCEAN) 0.65 % SOLN NASAL SPRAY    Place 1 spray into both nostrils as needed for congestion.   SODIUM CHLORIDE-SODIUM BICARB (NETI POT SINUS WASH) 2300-700 MG KIT    Place 1 application into the nose 3 (three) times daily as needed (sinus congestion).  Modified Medications   No medications on file  Discontinued Medications   FLUTICASONE (CUTIVATE) 0.05 % CREAM    Apply 1 application topically 2 (two) times daily as needed (nickel rash).      Review of Systems  As above. All other systems reviewed are negative  Filed Vitals:   03/31/14 1448  BP: 112/68  Pulse: 95  Temp: 98.5 F (36.9 C)  TempSrc: Oral  Resp: 10  Weight: 151 lb (68.493 kg)  SpO2: 96%   Body mass index is 27.09 kg/(m^2).  Physical Exam  Constitutional: She appears well-developed and  well-nourished.  Eyes: EOM are normal. Pupils are equal, round, and reactive to light. Right eye exhibits discharge. Left eye exhibits no discharge. Right conjunctiva is injected. Right conjunctiva has no hemorrhage. Left conjunctiva is not injected. Left conjunctiva has no hemorrhage. No scleral icterus.       Labs reviewed: No visits with results within 3 Month(s) from this visit.      Assessment/Plan   ICD-9-CM ICD-10-CM   1. Conjunctivitis of right eye 372.30 H10.9 tobramycin-dexamethasone (TOBRADEX) ophthalmic solution  2. Other seasonal allergic rhinitis 477.8 J30.2    --wash hands frequently  --needs 24 hrs of tx prior to returning to work. May return on 03/31/14. Note given  --keep scheduled appt next week with Dr Arletha Grippe. Perlie Gold  [facility] [address] [place], [place] [address] [phone] Office (Wednesdays and Fridays 8 AM - 5 PM) [phone] Cell (Monday-Friday 8 AM - 5 PM)

## 2014-04-07 ENCOUNTER — Encounter: Payer: Self-pay | Admitting: Internal Medicine

## 2014-04-07 ENCOUNTER — Ambulatory Visit (INDEPENDENT_AMBULATORY_CARE_PROVIDER_SITE_OTHER): Payer: No Typology Code available for payment source | Admitting: Internal Medicine

## 2014-04-07 VITALS — BP 112/80 | HR 76 | Temp 98.4°F | Resp 18 | Ht 62.0 in | Wt 148.0 lb

## 2014-04-07 DIAGNOSIS — F419 Anxiety disorder, unspecified: Secondary | ICD-10-CM

## 2014-04-07 DIAGNOSIS — H109 Unspecified conjunctivitis: Secondary | ICD-10-CM

## 2014-04-07 MED ORDER — CIPROFLOXACIN HCL 0.3 % OP SOLN: 2.0000 [drp] | Freq: Four times a day (QID) | OPHTHALMIC | Status: AC

## 2014-04-07 NOTE — Progress Notes (Signed)
Patient ID: Brandi Lucero, female   DOB: [birth year], 29 y.o.   MRN: [id number]    Facility  [facility]    Place of Service:   OFFICE   Allergies  Allergen Reactions  . Vicodin [Hydrocodone-Acetaminophen]     Bad headache     Chief Complaint  Patient presents with  . Medical Management of Chronic Issues    f/u for conjuctivitis    HPI:  29 yo female seen today for f/u anxiety. She reports lorazepam is working. She had to reschedule her psych appt due to conflict with work. Eye has not cleared and now she has eye pain again with increased yellow d/c in OU. No f/c. No HA/dizziness. (+) photophobia on left. She has sore throat. She completed medrol dose pak that was Rx in December on yesterday. She has been working in [employer]  Medications: Patient's Medications  New Prescriptions   No medications on file  Previous Medications   ACETAMINOPHEN (TYLENOL) 500 MG TABLET    500 mg. Once every 2 weeks   ASPIRIN-CAFFEINE (BC FAST PAIN RELIEF PO)    Take by mouth. As needed for Headache   FEXOFENADINE (ALLEGRA) 60 MG TABLET    Take 3 tablets (180 mg total) by mouth daily.   FLUTICASONE (FLONASE) 50 MCG/ACT NASAL SPRAY    Place 2 sprays into both nostrils daily.   LORAZEPAM (ATIVAN) 0.5 MG TABLET    Take 1/2 tablet twice daily   METHYLPREDNISOLONE (MEDROL DOSPACK) 4 MG TABLET    follow package directions   PHENYLEPHRINE-APAP-GUAIFENESIN (MUCINEX SINUS-MAX PO)    Take by mouth as needed.   SODIUM CHLORIDE (OCEAN) 0.65 % SOLN NASAL SPRAY    Place 1 spray into both nostrils as needed for congestion.   SODIUM CHLORIDE-SODIUM BICARB (NETI POT SINUS WASH) 2300-700 MG KIT    Place 1 application into the nose 3 (three) times daily as needed (sinus congestion).   TOBRAMYCIN-DEXAMETHASONE (TOBRADEX) OPHTHALMIC SOLUTION    Place 1 drop into the right eye every 4 (four) hours while awake. Use at least 3 times daily  Modified Medications   No medications on file  Discontinued Medications   No  medications on file     Review of Systems  As above. All other systems reviewed are negative.  Filed Vitals:   04/07/14 1552  BP: 112/80  Pulse: 76  Temp: 98.4 F (36.9 C)  TempSrc: Oral  Resp: 18  Height: _0  (1.575 m)  Weight: 148 lb (67.132 kg)  SpO2: 98%   Body mass index is 27.06 kg/(m^2).  Physical Exam  Constitutional: Vital signs are normal. She appears well-developed and well-nourished.  Non-toxic appearance. She does not have a sickly appearance. She appears ill. No distress.  Eyes: EOM are normal. Pupils are equal, round, and reactive to light. Right eye exhibits discharge. Right eye exhibits no chemosis, no exudate and no hordeolum. No foreign body present in the right eye. Left eye exhibits discharge. Left eye exhibits no chemosis, no exudate and no hordeolum. No foreign body present in the left eye. Right conjunctiva is injected. Right conjunctiva has no hemorrhage. Left conjunctiva is injected. Left conjunctiva has no hemorrhage. No scleral icterus.    Skin: She is not diaphoretic.     Labs reviewed: No visits with results within 3 Month(s) from this visit. Latest known visit with results is:  Admission on 05/30/2012, Discharged on 05/30/2012  Component Date Value Ref Range Status  . Color, Urine 05/30/2012 YELLOW  YELLOW Final  . APPearance 05/30/2012 CLEAR  CLEAR Final  . Specific Gravity, Urine 05/30/2012 1.015  1.005 - 1.030 Final  . pH 05/30/2012 7.0  5.0 - 8.0 Final  . Glucose, UA 05/30/2012 NEGATIVE  NEGATIVE mg/dL Final  . Hgb urine dipstick 05/30/2012 NEGATIVE  NEGATIVE Final  . Bilirubin Urine 05/30/2012 NEGATIVE  NEGATIVE Final  . Ketones, ur 05/30/2012 NEGATIVE  NEGATIVE mg/dL Final  . Protein, ur 05/30/2012 NEGATIVE  NEGATIVE mg/dL Final  . Urobilinogen, UA 05/30/2012 2.0* 0.0 - 1.0 mg/dL Final  . Nitrite 05/30/2012 NEGATIVE  NEGATIVE Final  . Leukocytes, UA 05/30/2012 NEGATIVE  NEGATIVE Final   MICROSCOPIC NOT DONE ON URINES WITH NEGATIVE  PROTEIN, BLOOD, LEUKOCYTES, NITRITE, OR GLUCOSE <1000 mg/dL.     Assessment/Plan   ICD-9-CM ICD-10-CM   1. Conjunctivitis of left eye- new 372.30 H10.9 ciprofloxacin (CILOXAN) 0.3 % ophthalmic solution  2. Conjunctivitis of right eye - improving 372.30 H10.9 ciprofloxacin (CILOXAN) 0.3 % ophthalmic solution  3. Anxiety stable 300.00 F41.9    Hand wash frequently  No work x 2 days. Work note given  Apply warm compress to eyes as needed  If no better in next 3 days, will need referral to eye specialist  RTO in 1 month to f/u anxiety. Reschedule appt with psych    Cordella Register. Perlie Gold  [facility] [address] [place], [place] [address] [phone] Office (Wednesdays and Fridays 8 AM - 5 PM) [phone] Cell (Monday-Friday 8 AM - 5 PM)

## 2014-04-07 NOTE — Patient Instructions (Signed)
Hand wash frequently  No work x 2 days.   Apply warm compress to eyes as needed  If no better in next 3 days, will need referral to eye specialist

## 2014-04-24 ENCOUNTER — Telehealth: Payer: Self-pay | Admitting: *Deleted

## 2014-04-24 ENCOUNTER — Other Ambulatory Visit: Payer: Self-pay | Admitting: *Deleted

## 2014-04-24 DIAGNOSIS — F419 Anxiety disorder, unspecified: Secondary | ICD-10-CM

## 2014-04-24 NOTE — Telephone Encounter (Signed)
Patient called and stated that she wants a refill on her Lorazepam. Is this ok? I saw in your note in December that you wanted her to follow up with Psychiatrist. Please Advise.

## 2014-04-25 NOTE — Telephone Encounter (Signed)
Ok x 1. Please tell pt she needs to f/u with psych for future refills. She had to reschedule last appt due to illness and work schedule.

## 2014-04-26 MED ORDER — LORAZEPAM 0.5 MG PO TABS: ORAL_TABLET | ORAL | Status: DC

## 2014-04-26 NOTE — Telephone Encounter (Signed)
Patient Notified and faxed Rx to pharmacy. Patient will call back and schedule follow up appointment.

## 2014-05-12 ENCOUNTER — Encounter: Payer: Self-pay | Admitting: Internal Medicine

## 2014-05-12 ENCOUNTER — Ambulatory Visit (INDEPENDENT_AMBULATORY_CARE_PROVIDER_SITE_OTHER): Payer: No Typology Code available for payment source | Admitting: Internal Medicine

## 2014-05-12 VITALS — BP 110/66 | HR 83 | Temp 98.2°F | Resp 18 | Ht 62.0 in | Wt 147.6 lb

## 2014-05-12 DIAGNOSIS — F5102 Adjustment insomnia: Secondary | ICD-10-CM

## 2014-05-12 DIAGNOSIS — F419 Anxiety disorder, unspecified: Secondary | ICD-10-CM

## 2014-05-12 MED ORDER — ZOLPIDEM TARTRATE 5 MG PO TABS: 5.0000 mg | ORAL_TABLET | Freq: Every evening | ORAL | Status: DC | PRN

## 2014-05-12 NOTE — Patient Instructions (Signed)
Keep appointment with Dr Manson PasseyBrown as scheduled. Continue therapy.  Follow up in 3 months

## 2014-05-12 NOTE — Progress Notes (Signed)
Patient ID: Brandi Lucero, female   DOB: 1985-08-22, 29 y.o.   MRN: [id number]    Facility  [facility]    Place of Service:   OFFICE   Allergies  Allergen Reactions  . Vicodin [Hydrocodone-Acetaminophen]     Bad headache     Chief Complaint  Patient presents with  . Medication Management    Discuss medication refills    HPI:  29 yo female seen today for f/u. She reports increased stressors at home. She was tx for chlamydia and is awaiting results of HIV testing. Her significant other was unfaithful. She has insomnia and increased worry.  She saw a therapist and has an appt with Psych Dr Owens Shark at [facility]. She will continue counseling.  Past Medical History  Diagnosis Date  . IBS (irritable bowel syndrome) 2009  . Ectopic pregnancy   . Anxiety   . Depression    History   Social History  . Marital Status: Single    Spouse Name: N/A  . Number of Children: N/A  . Years of Education: N/A   Social History Main Topics  . Smoking status: Former Smoker -- 0.50 packs/day    Types: Cigarettes  . Smokeless tobacco: Not on file  . Alcohol Use: 1.2 oz/week    2 Standard drinks or equivalent per week  . Drug Use: No  . Sexual Activity: Yes    Birth Control/ Protection: None   Other Topics Concern  . None   Social History Narrative   Single   Lives in one stories house with 2 persons, 4 dogs   Current profession: Conservation officer, nature    Exercises 3 x monthly at gym      Medications: Patient's Medications  New Prescriptions   No medications on file  Previous Medications   ACETAMINOPHEN (TYLENOL) 500 MG TABLET    500 mg. Once every 2 weeks   ASPIRIN-CAFFEINE (BC FAST PAIN RELIEF PO)    Take by mouth. As needed for Headache   FEXOFENADINE (ALLEGRA) 60 MG TABLET    Take 3 tablets (180 mg total) by mouth daily.   FLUTICASONE (FLONASE) 50 MCG/ACT NASAL SPRAY    Place 2 sprays into both nostrils daily.   LORAZEPAM (ATIVAN) 0.5 MG TABLET    Take 1/2 tablet by mouth twice  daily for anxiety   METHYLPREDNISOLONE (MEDROL DOSPACK) 4 MG TABLET    follow package directions   PHENYLEPHRINE-APAP-GUAIFENESIN (MUCINEX SINUS-MAX PO)    Take by mouth as needed.   SODIUM CHLORIDE (OCEAN) 0.65 % SOLN NASAL SPRAY    Place 1 spray into both nostrils as needed for congestion.   SODIUM CHLORIDE-SODIUM BICARB (NETI POT SINUS WASH) 2300-700 MG KIT    Place 1 application into the nose 3 (three) times daily as needed (sinus congestion).   TOBRAMYCIN-DEXAMETHASONE (TOBRADEX) OPHTHALMIC SOLUTION    Place 1 drop into the right eye every 4 (four) hours while awake. Use at least 3 times daily  Modified Medications   No medications on file  Discontinued Medications   No medications on file     Review of Systems  Constitutional: Positive for fatigue.  Eyes: Negative for pain, discharge, itching and visual disturbance.  Respiratory: Negative for cough, chest tightness and shortness of breath.   Cardiovascular: Negative for chest pain, palpitations and leg swelling.  Genitourinary: Negative for vaginal discharge and vaginal pain.  Psychiatric/Behavioral: Positive for sleep disturbance. Negative for suicidal ideas. The patient is nervous/anxious.     Filed Vitals:   05/12/14  1436  BP: 110/66  Pulse: 83  Temp: 98.2 F (36.8 C)  TempSrc: Oral  Resp: 18  Height: '5\' 2"'  (1.575 m)  Weight: 147 lb 9.6 oz (66.951 kg)  SpO2: 99%   Body mass index is 26.99 kg/(m^2).  Physical Exam  Constitutional: She appears well-developed and well-nourished. No distress.  Eyes: Conjunctivae are normal. Pupils are equal, round, and reactive to light. Right eye exhibits no discharge. Left eye exhibits no discharge.  Cardiovascular: Normal rate, regular rhythm, normal heart sounds and intact distal pulses.  Exam reveals no gallop and no friction rub.   No murmur heard. No carotid bruit b/l. No LE swelling b/l. No calf TTP  Pulmonary/Chest: Effort normal and breath sounds normal. No respiratory  distress. She has no wheezes. She has no rales.  Psychiatric: Her speech is normal. Judgment and thought content normal. Her mood appears anxious. She is agitated.     Labs reviewed: No visits with results within 3 Month(s) from this visit.   Assessment/Plan   ICD-9-CM ICD-10-CM   1. Anxiety - worse due to family stressors; on prn lorazepam 300.00 F41.9   2. 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 She has taken this in the past with relief. Declines belsomra as she is not sure insurance will cover. Discussed ned for full night's rest and go to bed as soon as she takes med. She had sleep walking in the past while taking med. She was unaware that she needed to already be in the bed.  --encouraged safe sex practices  --f/u with therapist and psych for mx anxiety.  --RTO in 3 mos for f/u. Will need labs at that time  [place]. Perlie Gold  [facility] [address] [place], [place] [address] [phone] Office (Wednesdays and Fridays 8 AM - 5 PM) [phone] Cell (Monday-Friday 8 AM - 5 PM)

## 2014-06-30 ENCOUNTER — Ambulatory Visit: Payer: No Typology Code available for payment source | Admitting: Internal Medicine

## 2014-06-30 ENCOUNTER — Other Ambulatory Visit: Payer: Self-pay | Admitting: *Deleted

## 2014-06-30 DIAGNOSIS — F5102 Adjustment insomnia: Secondary | ICD-10-CM

## 2014-06-30 DIAGNOSIS — F419 Anxiety disorder, unspecified: Secondary | ICD-10-CM

## 2014-06-30 MED ORDER — ZOLPIDEM TARTRATE 5 MG PO TABS: 5.0000 mg | ORAL_TABLET | Freq: Every evening | ORAL | Status: DC | PRN

## 2014-06-30 MED ORDER — LORAZEPAM 0.5 MG PO TABS: ORAL_TABLET | ORAL | Status: DC

## 2014-06-30 NOTE — Telephone Encounter (Signed)
Patient accidentally cancelled her appointment on the automatic phone call. Walked in and requested appointment back but none available. Rescheduled and needed refills until then.

## 2014-08-16 ENCOUNTER — Encounter: Payer: Self-pay | Admitting: Internal Medicine

## 2014-08-16 ENCOUNTER — Ambulatory Visit (INDEPENDENT_AMBULATORY_CARE_PROVIDER_SITE_OTHER): Payer: No Typology Code available for payment source | Admitting: Internal Medicine

## 2014-08-16 VITALS — BP 150/100 | HR 83 | Temp 98.3°F | Resp 18 | Ht 62.0 in | Wt 148.4 lb

## 2014-08-16 DIAGNOSIS — M7989 Other specified soft tissue disorders: Secondary | ICD-10-CM | POA: Diagnosis not present

## 2014-08-16 DIAGNOSIS — L989 Disorder of the skin and subcutaneous tissue, unspecified: Secondary | ICD-10-CM

## 2014-08-16 DIAGNOSIS — F419 Anxiety disorder, unspecified: Secondary | ICD-10-CM | POA: Diagnosis not present

## 2014-08-16 DIAGNOSIS — J302 Other seasonal allergic rhinitis: Secondary | ICD-10-CM | POA: Diagnosis not present

## 2014-08-16 DIAGNOSIS — F3181 Bipolar II disorder: Secondary | ICD-10-CM

## 2014-08-16 DIAGNOSIS — R030 Elevated blood-pressure reading, without diagnosis of hypertension: Secondary | ICD-10-CM | POA: Diagnosis not present

## 2014-08-16 NOTE — Progress Notes (Signed)
Patient ID: Brandi Lucero, female   DOB: 25-Jun-1985, 29 y.o.   MRN: [id number]    Location:    [facility]   Place of Service:   OFFICE  Chief Complaint  Patient presents with  . Medical Management of Chronic Issues    HPI:  29 yo female seen today. She was seen by psych and Rx gabapentin 48m TID for bipolar d/o. She has not started gabapentin as she was c/a ADRs especially affecting her "blood" and that she does "not want to get leukemia". she wanted to s/w me before beginning the med. Since her last visit, she has gotten a tattoo because "I woke up one morning and wanted one". She was told by psych to continue lorazepam prn.   She has swelling in right medial thigh after getting a tattoo 2 weeks ago. No redness  She is c/a tip of her nose red spot that bled after she tried to burst it. No FHx skin cancer  No other concerns, no falls since last OV. No hospital visits.  Past Medical History  Diagnosis Date  . IBS (irritable bowel syndrome) 2009  . Ectopic pregnancy   . Anxiety   . Depression     Past Surgical History  Procedure Laterality Date  . Laparoscopy for ectopic pregnancy  03/03/2004  . Wisdom tooth extraction    . Dilation and evacuation  03/03/2012  . Ectopic pregnancy surgery  03/03/2004    Patient Care Team: VLance Bosch NP as PCP - General (Internal Medicine)  History   Social History  . Marital Status: Single    Spouse Name: N/A  . Number of Children: N/A  . Years of Education: N/A   Occupational History  . Not on file.   Social History Main Topics  . Smoking status: Former Smoker -- 0.50 packs/day    Types: Cigarettes  . Smokeless tobacco: Not on file  . Alcohol Use: 1.2 oz/week    2 Standard drinks or equivalent per week  . Drug Use: No  . Sexual Activity: Yes    Birth Control/ Protection: None   Other Topics Concern  . Not on file   Social History Narrative   Single   Lives in one stories house with 2 persons, 4 dogs   Current  profession: [employer]    Exercises 3 x monthly at gym      reports that she has quit smoking. Her smoking use included Cigarettes. She smoked 0.50 packs per day. She does not have any smokeless tobacco history on file. She reports that she drinks about 1.2 oz of alcohol per week. She reports that she does not use illicit drugs.  Allergies  Allergen Reactions  . Vicodin [Hydrocodone-Acetaminophen]     Bad headache     Medications: Patient's Medications  New Prescriptions   No medications on file  Previous Medications   ACETAMINOPHEN (TYLENOL) 500 MG TABLET    500 mg. Once every 2 weeks   ASPIRIN-CAFFEINE (BC FAST PAIN RELIEF PO)    Take by mouth. As needed for Headache   FEXOFENADINE (ALLEGRA) 60 MG TABLET    Take 3 tablets (180 mg total) by mouth daily.   FLUTICASONE (FLONASE) 50 MCG/ACT NASAL SPRAY    Place 2 sprays into both nostrils daily.   LORAZEPAM (ATIVAN) 0.5 MG TABLET    Take 1/2 tablet by mouth twice daily for anxiety   PHENYLEPHRINE-APAP-GUAIFENESIN (MUCINEX SINUS-MAX PO)    Take by mouth as needed.   SODIUM CHLORIDE (  OCEAN) 0.65 % SOLN NASAL SPRAY    Place 1 spray into both nostrils as needed for congestion.   SODIUM CHLORIDE-SODIUM BICARB (NETI POT SINUS WASH) 2300-700 MG KIT    Place 1 application into the nose 3 (three) times daily as needed (sinus congestion).   ZOLPIDEM (AMBIEN) 5 MG TABLET    Take 1 tablet (5 mg total) by mouth at bedtime as needed for sleep.  Modified Medications   No medications on file  Discontinued Medications   No medications on file    Review of Systems  Unable to perform ROS: Psychiatric disorder  uncontrolled bipolar  Filed Vitals:   08/16/14 1540  BP: 150/100  Pulse: 83  Temp: 98.3 F (36.8 C)  TempSrc: Oral  Resp: 18  Height: '5\' 2"'  (1.575 m)  Weight: 148 lb 6.4 oz (67.314 kg)  SpO2: 98%   Body mass index is 27.14 kg/(m^2).  Physical Exam  Constitutional: She is oriented to person, place, and time. She appears  well-developed and well-nourished.  HENT:  Mouth/Throat: Oropharynx is clear and moist. No oropharyngeal exudate.  Eyes: Pupils are equal, round, and reactive to light. No scleral icterus.  Neck: Neck supple. Carotid bruit is not present. No tracheal deviation present. No thyromegaly present.  Cardiovascular: Normal rate, regular rhythm, normal heart sounds and intact distal pulses.  Exam reveals no gallop and no friction rub.   No murmur heard. No LE edema b/l. no calf TTP.   Pulmonary/Chest: Effort normal and breath sounds normal. No stridor. No respiratory distress. She has no wheezes. She has no rales.  Abdominal: Soft. Bowel sounds are normal. She exhibits no distension and no mass. There is no tenderness. There is no rebound and no guarding.  Lymphadenopathy:    She has no cervical adenopathy.  Neurological: She is alert and oriented to person, place, and time. She has normal reflexes.  Skin: Skin is warm and dry. No rash noted.     Psychiatric: Her behavior is normal. Judgment and thought content normal. Her mood appears anxious. Her speech is rapid and/or pressured.     Labs reviewed: No visits with results within 3 Month(s) from this visit.   No results found.   Assessment/Plan   ICD-9-CM ICD-10-CM   1. Bipolar 2 disorder - uncontrolled with frequent manic episodes 296.89 F31.81   2. Anxiety - uncontrolled 300.00 F41.9   3. Other seasonal allergic rhinitis - stable 477.8 J30.2   4. Swelling of thigh - due to recent tattoo. Reassurance given 729.81 M79.89   5. Skin abnormality at tip of nose - likely a sebeacum. benign. Reassurance given 757.9 L98.9   6.      Elevated blood pressure - probably due to #1. Re-evaluate at next visit  --reassurance offered to pt to start gabapentin to help reduce manic episodes  --recommend dematology eval but states she cannot afford $80 copay   --Continue current medications as ordered  --Follow up in 1 month. Will check CMP at that  visit  --Keep appt with mental health. Start gabapentin 424m at bedtime and titrate to 3 times daily as tolerated  Leanndra Pember S. CPerlie Gold PPh[facility] [address] [phone] (Monday-Friday 8 AM - 5 PM) [phone] 5 PM and follow prompts

## 2014-08-16 NOTE — Patient Instructions (Addendum)
Continue current medications as ordered  Follow up in 1 month   Keep appt with mental health. Start gabapentin 400mg  at bedtime and titrate to 3 times daily as tolerated

## 2014-09-05 ENCOUNTER — Telehealth: Payer: Self-pay

## 2014-09-05 DIAGNOSIS — F5102 Adjustment insomnia: Secondary | ICD-10-CM

## 2014-09-05 MED ORDER — ZOLPIDEM TARTRATE 5 MG PO TABS: 5.0000 mg | ORAL_TABLET | Freq: Every evening | ORAL | Status: DC | PRN

## 2014-09-05 NOTE — Telephone Encounter (Signed)
Message was left on triage voicemail- patient needs refill on Ambien.  RX sent to pharmacy, patient aware

## 2014-09-22 ENCOUNTER — Other Ambulatory Visit: Payer: Self-pay | Admitting: *Deleted

## 2014-09-22 DIAGNOSIS — F419 Anxiety disorder, unspecified: Secondary | ICD-10-CM

## 2014-09-22 MED ORDER — LORAZEPAM 0.5 MG PO TABS: ORAL_TABLET | ORAL | Status: DC

## 2014-09-22 NOTE — Telephone Encounter (Signed)
Patient requested refill and has been following up with psychiatrist. Confirmed appointment with Dr. Montez Morita for 10/04/2014 with patient.

## 2014-10-04 ENCOUNTER — Ambulatory Visit: Payer: No Typology Code available for payment source | Admitting: Internal Medicine

## 2014-10-13 ENCOUNTER — Encounter: Payer: Self-pay | Admitting: Internal Medicine

## 2014-10-13 ENCOUNTER — Ambulatory Visit (INDEPENDENT_AMBULATORY_CARE_PROVIDER_SITE_OTHER): Payer: No Typology Code available for payment source | Admitting: Internal Medicine

## 2014-10-13 ENCOUNTER — Telehealth: Payer: Self-pay | Admitting: *Deleted

## 2014-10-13 VITALS — BP 120/76 | HR 84 | Temp 98.2°F | Resp 18 | Ht 62.0 in | Wt 147.2 lb

## 2014-10-13 DIAGNOSIS — F3181 Bipolar II disorder: Secondary | ICD-10-CM

## 2014-10-13 DIAGNOSIS — F5102 Adjustment insomnia: Secondary | ICD-10-CM | POA: Diagnosis not present

## 2014-10-13 DIAGNOSIS — F419 Anxiety disorder, unspecified: Secondary | ICD-10-CM

## 2014-10-13 DIAGNOSIS — Z0289 Encounter for other administrative examinations: Secondary | ICD-10-CM

## 2014-10-13 NOTE — Patient Instructions (Signed)
Continue current medications as ordered  DMV form completed  Follow up for routine visit

## 2014-10-13 NOTE — Telephone Encounter (Signed)
Patient came in for an appointment with Dr. Montez Morita to have [facility] Forms for Bus Driving filled out, but patient didn't sign and fill out her portion. I called patient and left message that we would need her to come back by to sign and fill out before we can mail them to [place] [employer] of Motor Vehicles Medical Review Branch.

## 2014-10-13 NOTE — Progress Notes (Signed)
Patient ID: Brandi Lucero, female   DOB: [birth year], 29 y.o.   MRN: [id number]    Location:    [facility]   Place of Service:  OFFICE   Chief Complaint  Patient presents with  . Medical Management of Chronic Issues    Patient here to have form filed out    HPI:  29 yo female seen today for f/u. 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 Needs [place] DMV form completed due to hx bipolar 2 and anxiety. She is doing well on lorazepam BID prn. She is seeing a therapist also.  No other concerns. Her mother is present  Past Medical History  Diagnosis Date  . IBS (irritable bowel syndrome) 2009  . Ectopic pregnancy   . Anxiety   . Depression     Past Surgical History  Procedure Laterality Date  . Laparoscopy for ectopic pregnancy  03/03/2004  . Wisdom tooth extraction    . Dilation and evacuation  03/03/2012  . Ectopic pregnancy surgery  03/03/2004    Patient Care Team: Gildardo Cranker, DO as PCP - General (Internal Medicine)  Social History   Social History  . Marital Status: Single    Spouse Name: N/A  . Number of Children: N/A  . Years of Education: N/A   Occupational History  . Not on file.   Social History Main Topics  . Smoking status: Former Smoker -- 0.50 packs/day    Types: Cigarettes  . Smokeless tobacco: Never Used  . Alcohol Use: 1.2 oz/week    2 Standard drinks or equivalent per week  . Drug Use: No  . Sexual Activity: Yes    Birth Control/ Protection: None   Other Topics Concern  . Not on file   Social History Narrative   Single   Lives in one stories house with 2 persons, 4 dogs   Current profession: [employer]    Exercises 3 x monthly at gym      reports that she has quit smoking. Her smoking use included Cigarettes. She smoked 0.50 packs per day. She has never used smokeless tobacco. She reports that she drinks about 1.2 oz of alcohol per week. She reports that she does not use illicit drugs.  Allergies  Allergen Reactions  .  Vicodin [Hydrocodone-Acetaminophen]     Bad headache     Medications: Patient's Medications  New Prescriptions   No medications on file  Previous Medications   ACETAMINOPHEN (TYLENOL) 500 MG TABLET    500 mg. Once every 2 weeks   ASPIRIN-CAFFEINE (BC FAST PAIN RELIEF PO)    Take by mouth. As needed for Headache   FEXOFENADINE (ALLEGRA) 60 MG TABLET    Take 3 tablets (180 mg total) by mouth daily.   FLUTICASONE (FLONASE) 50 MCG/ACT NASAL SPRAY    Place 2 sprays into both nostrils daily.   LORAZEPAM (ATIVAN) 0.5 MG TABLET    Take 1/2 tablet by mouth twice daily for anxiety   PHENYLEPHRINE-APAP-GUAIFENESIN (MUCINEX SINUS-MAX PO)    Take by mouth as needed.   SODIUM CHLORIDE (OCEAN) 0.65 % SOLN NASAL SPRAY    Place 1 spray into both nostrils as needed for congestion.   SODIUM CHLORIDE-SODIUM BICARB (NETI POT SINUS WASH) 2300-700 MG KIT    Place 1 application into the nose 3 (three) times daily as needed (sinus congestion).   ZOLPIDEM (AMBIEN) 5 MG TABLET    Take 1 tablet (5 mg total) by mouth at bedtime as needed for sleep.  Modified Medications   No medications on file  Discontinued Medications   No medications on file    Review of Systems  Constitutional: Negative for fever, chills, diaphoresis, activity change, appetite change and fatigue.  HENT: Negative for ear pain and sore throat.   Eyes: Negative for visual disturbance.  Respiratory: Negative for cough, chest tightness and shortness of breath.   Cardiovascular: Negative for chest pain, palpitations and leg swelling.  Gastrointestinal: Negative for nausea, vomiting, abdominal pain, diarrhea, constipation and blood in stool.  Genitourinary: Negative for dysuria.  Musculoskeletal: Negative for arthralgias.  Neurological: Negative for dizziness, tremors, numbness and headaches.  Psychiatric/Behavioral: Negative for sleep disturbance. The patient is nervous/anxious.     Filed Vitals:   10/13/14 1442  BP: 120/76  Pulse: 84    Temp: 98.2 F (36.8 C)  TempSrc: Oral  Resp: 18  Height: '5\' 2"'  (1.575 m)  Weight: 147 lb 3.2 oz (66.769 kg)  SpO2: 99%   Body mass index is 26.92 kg/(m^2).  Physical Exam  Constitutional: She is oriented to person, place, and time. She appears well-developed and well-nourished. No distress.  Musculoskeletal: Normal range of motion.  Neurological: She is alert and oriented to person, place, and time.  Skin: Skin is warm and dry. No rash noted.  Psychiatric: Her speech is normal and behavior is normal. Thought content normal. Her mood appears anxious.     Labs reviewed: No visits with results within 3 Month(s) from this visit. Latest known visit with results is:  Admission on 05/30/2012, Discharged on 05/30/2012  Component Date Value Ref Range Status  . Color, Urine 05/30/2012 YELLOW  YELLOW Final  . APPearance 05/30/2012 CLEAR  CLEAR Final  . Specific Gravity, Urine 05/30/2012 1.015  1.005 - 1.030 Final  . pH 05/30/2012 7.0  5.0 - 8.0 Final  . Glucose, UA 05/30/2012 NEGATIVE  NEGATIVE mg/dL Final  . Hgb urine dipstick 05/30/2012 NEGATIVE  NEGATIVE Final  . Bilirubin Urine 05/30/2012 NEGATIVE  NEGATIVE Final  . Ketones, ur 05/30/2012 NEGATIVE  NEGATIVE mg/dL Final  . Protein, ur 05/30/2012 NEGATIVE  NEGATIVE mg/dL Final  . Urobilinogen, UA 05/30/2012 2.0* 0.0 - 1.0 mg/dL Final  . Nitrite 05/30/2012 NEGATIVE  NEGATIVE Final  . Leukocytes, UA 05/30/2012 NEGATIVE  NEGATIVE Final   MICROSCOPIC NOT DONE ON URINES WITH NEGATIVE PROTEIN, BLOOD, LEUKOCYTES, NITRITE, OR GLUCOSE <1000 mg/dL.     Assessment/Plan   ICD-9-CM ICD-10-CM   1. Bipolar 2 disorder - stable 296.89 F31.81   2. Anxiety - stable 300.00 F41.9   3. 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 Encounter for completion of form with patient V68.89 Z02.89    --[place] DMV form completed and copy to be scanned into chart  --f/u as scheduled. Will check CMP, CBC w diff and TSH prior to appt  [facility] S. Perlie Gold  [facility] [address] [place],  [address] [phone] Cell (Monday-Friday 8 AM - 5 PM) [phone] After 5 PM and follow prompts

## 2014-10-17 NOTE — Telephone Encounter (Signed)
Patient filled out her portion and signed and I copied and mailed forms to : [place] [employer] of Motor Vehicles Medical Review Branch [address] [place], [place] [address]. Customer #: [id number] [phone] Fax:[phone]

## 2014-10-18 ENCOUNTER — Ambulatory Visit: Payer: No Typology Code available for payment source | Admitting: Internal Medicine

## 2014-10-20 ENCOUNTER — Other Ambulatory Visit: Payer: No Typology Code available for payment source

## 2014-10-25 ENCOUNTER — Encounter: Payer: No Typology Code available for payment source | Admitting: Internal Medicine

## 2014-10-25 ENCOUNTER — Ambulatory Visit: Payer: Self-pay | Admitting: Internal Medicine

## 2014-10-25 DIAGNOSIS — Z0289 Encounter for other administrative examinations: Secondary | ICD-10-CM

## 2014-10-26 NOTE — Progress Notes (Signed)
This encounter was created in error - please disregard.

## 2014-11-09 ENCOUNTER — Telehealth: Payer: Self-pay | Admitting: Internal Medicine

## 2014-11-09 NOTE — Telephone Encounter (Signed)
Called patient on 10/25/2014 and 11/09/2014 to reschedule appointment. Patient stated that she just started a new job and would have to call back at a later time to reschedule.

## 2014-11-14 NOTE — Telephone Encounter (Signed)
Patient called and stated that DMV told her they still haven't received her forms. Printed our copy off and faxed to [facility] Fax #: [phone]

## 2014-11-17 ENCOUNTER — Telehealth: Payer: Self-pay | Admitting: *Deleted

## 2014-11-17 NOTE — Telephone Encounter (Signed)
Patient called requesting Lorazepam Refill. Note in February stated for patient to get her Lorazepam refills through her psychiatrist. Patient notified and agreed.

## 2015-04-25 ENCOUNTER — Emergency Department (HOSPITAL_COMMUNITY)
Admission: EM | Admit: 2015-04-25 | Discharge: 2015-04-25 | Disposition: A | Discharge status: Home / Self Care | Payer: BLUE CROSS/BLUE SHIELD | Attending: Emergency Medicine | Admitting: Emergency Medicine

## 2015-04-25 ENCOUNTER — Encounter (HOSPITAL_COMMUNITY): Payer: Self-pay

## 2015-04-25 DIAGNOSIS — H9202 Otalgia, left ear: Secondary | ICD-10-CM | POA: Insufficient documentation

## 2015-04-25 DIAGNOSIS — Z87891 Personal history of nicotine dependence: Secondary | ICD-10-CM | POA: Diagnosis not present

## 2015-04-25 DIAGNOSIS — J309 Allergic rhinitis, unspecified: Secondary | ICD-10-CM | POA: Diagnosis not present

## 2015-04-25 DIAGNOSIS — Z79899 Other long term (current) drug therapy: Secondary | ICD-10-CM | POA: Diagnosis not present

## 2015-04-25 DIAGNOSIS — F329 Major depressive disorder, single episode, unspecified: Secondary | ICD-10-CM | POA: Insufficient documentation

## 2015-04-25 DIAGNOSIS — R220 Localized swelling, mass and lump, head: Secondary | ICD-10-CM | POA: Insufficient documentation

## 2015-04-25 DIAGNOSIS — Z7951 Long term (current) use of inhaled steroids: Secondary | ICD-10-CM | POA: Insufficient documentation

## 2015-04-25 DIAGNOSIS — F419 Anxiety disorder, unspecified: Secondary | ICD-10-CM | POA: Insufficient documentation

## 2015-04-25 DIAGNOSIS — Z8719 Personal history of other diseases of the digestive system: Secondary | ICD-10-CM | POA: Insufficient documentation

## 2015-04-25 DIAGNOSIS — R05 Cough: Secondary | ICD-10-CM | POA: Diagnosis present

## 2015-04-25 MED ORDER — LORAZEPAM 0.5 MG PO TABS
0.5000 mg | ORAL_TABLET | Freq: Once | ORAL | Status: AC
  Administered 2015-04-25: 0.5 mg via ORAL
  Filled 2015-04-25: qty 1

## 2015-04-25 MED ORDER — FLUTICASONE PROPIONATE 50 MCG/ACT NA SUSP: 2.0000 | Freq: Every day | NASAL | Status: DC

## 2015-04-25 MED ORDER — CETIRIZINE HCL 10 MG PO TABS: 10.0000 mg | ORAL_TABLET | Freq: Every day | ORAL | Status: DC

## 2015-04-25 NOTE — Discharge Instructions (Signed)
Allergic Rhinitis Allergic rhinitis is when the mucous membranes in the nose respond to allergens. Allergens are particles in the air that cause your body to have an allergic reaction. This causes you to release allergic antibodies. Through a chain of events, these eventually cause you to release histamine into the blood stream. Although meant to protect the body, it is this release of histamine that causes your discomfort, such as frequent sneezing, congestion, and an itchy, runny nose.  CAUSES Seasonal allergic rhinitis (hay fever) is caused by pollen allergens that may come from grasses, trees, and weeds. Year-round allergic rhinitis (perennial allergic rhinitis) is caused by allergens such as house dust mites, pet dander, and mold spores. SYMPTOMS  Nasal stuffiness (congestion).  Itchy, runny nose with sneezing and tearing of the eyes. DIAGNOSIS Your health care provider can help you determine the allergen or allergens that trigger your symptoms. If you and your health care provider are unable to determine the allergen, skin or blood testing may be used. Your health care provider will diagnose your condition after taking your health history and performing a physical exam. Your health care provider may assess you for other related conditions, such as asthma, pink eye, or an ear infection. TREATMENT Allergic rhinitis does not have a cure, but it can be controlled by:  Medicines that block allergy symptoms. These may include allergy shots, nasal sprays, and oral antihistamines.  Avoiding the allergen. Hay fever may often be treated with antihistamines in pill or nasal spray forms. Antihistamines block the effects of histamine. There are over-the-counter medicines that may help with nasal congestion and swelling around the eyes. Check with your health care provider before taking or giving this medicine. If avoiding the allergen or the medicine prescribed do not work, there are many new medicines  your health care provider can prescribe. Stronger medicine may be used if initial measures are ineffective. Desensitizing injections can be used if medicine and avoidance does not work. Desensitization is when a patient is given ongoing shots until the body becomes less sensitive to the allergen. Make sure you follow up with your health care provider if problems continue. HOME CARE INSTRUCTIONS It is not possible to completely avoid allergens, but you can reduce your symptoms by taking steps to limit your exposure to them. It helps to know exactly what you are allergic to so that you can avoid your specific triggers. SEEK MEDICAL CARE IF:  You have a fever.  You develop a cough that does not stop easily (persistent).  You have shortness of breath.  You start wheezing.  Symptoms interfere with normal daily activities.   This information is not intended to replace advice given to you by your health care provider. Make sure you discuss any questions you have with your health care provider.   Document Released: 11/12/2000 Document Revised: 03/10/2014 Document Reviewed: 10/25/2012 Elsevier Interactive Patient Education 2016 Elsevier Inc.  Panic Attacks Panic attacks are sudden, short feelings of great fear or discomfort. You may have them for no reason when you are relaxed, when you are uneasy (anxious), or when you are sleeping.  HOME CARE  Take all your medicines as told.  Check with your doctor before starting new medicines.  Keep all doctor visits. GET HELP IF:  You are not able to take your medicines as told.  Your symptoms do not get better.  Your symptoms get worse. GET HELP RIGHT AWAY IF:  Your attacks seem different than your normal attacks.  You have thoughts  about hurting yourself or others.  You take panic attack medicine and you have a side effect. MAKE SURE YOU:  Understand these instructions.  Will watch your condition.  Will get help right away if you are  not doing well or get worse.   This information is not intended to replace advice given to you by your health care provider. Make sure you discuss any questions you have with your health care provider.   Document Released: 03/22/2010 Document Revised: 12/08/2012 Document Reviewed: 10/01/2012 Elsevier Interactive Patient Education [employer]

## 2015-04-25 NOTE — ED Notes (Signed)
Pt placed into gown 

## 2015-04-25 NOTE — ED Notes (Addendum)
Pt. Noticed this am while dressing that she has  Lump under her lt. Ear.  It is a size of a dime. She denies any pain or discomfort.  Last week she had cold symptoms. Pt. Has anxiety and is very tearful in triage.  She is afraid she might have leukemia due to a friend having

## 2015-04-25 NOTE — ED Provider Notes (Signed)
CSN: [id number]     Arrival date & time 04/25/15  0947 History  By signing my name below, I, Margaux Venter, attest that this documentation has been prepared under the direction and in the presence of Leisa Tapia, PA-C. Electronically Signed: Margaux Venter, ED Scribe. 04/25/2015. 12:33 PM.   Chief Complaint  Patient presents with  . Neck Pain   The history is provided by the patient. No language interpreter was used.     HPI Comments: Brandi Lucero is a 30 y.o. female who presents to the Emergency Department complaining of a lump underneath her left ear that she noticed today. Pt states that she was putting her earring in when she noticed the lump. She is concerned that she may have leukemia due to a friend of her's having similar symptoms 2 years ago who passed away from leukemia. She does admit to having anxiety issues. Pt does report that for 2 weeks she has had a cough, rhinorrhea, nasal congestion, sore throat, and left ear popping. Denies fever, chills, or any other associated symptoms.   Past Medical History  Diagnosis Date  . IBS (irritable bowel syndrome) 2009  . Ectopic pregnancy   . Anxiety   . Depression    Past Surgical History  Procedure Laterality Date  . Laparoscopy for ectopic pregnancy  03/03/2004  . Wisdom tooth extraction    . Dilation and evacuation  03/03/2012  . Ectopic pregnancy surgery  03/03/2004   Family History  Problem Relation Age of Onset  . Colon cancer Father   . High blood pressure Mother    Social History  Substance Use Topics  . Smoking status: Former Smoker -- 0.50 packs/day    Types: Cigarettes  . Smokeless tobacco: Never Used  . Alcohol Use: 1.2 oz/week    2 Standard drinks or equivalent per week   OB History    Gravida Para Term Preterm AB TAB SAB Ectopic Multiple Living   3    2  1 1       Review of Systems  Constitutional: Negative for fever and chills.  HENT: Positive for congestion, ear pain (Left), rhinorrhea and sore  throat.   Respiratory: Positive for cough.   Skin:       + Lump underneath left ear  All other systems reviewed and are negative.  Allergies  Vicodin  Home Medications   Prior to Admission medications   Medication Sig Start Date End Date Taking? Authorizing Provider  acetaminophen (TYLENOL) 500 MG tablet 500 mg. Once every 2 weeks    Historical Provider, MD  Aspirin-Caffeine (BC FAST PAIN RELIEF PO) Take by mouth. As needed for Headache    Historical Provider, MD  cetirizine (ZYRTEC ALLERGY) 10 MG tablet Take 1 tablet (10 mg total) by mouth at bedtime. 04/25/15   Leisa Tapia, PA-C  fexofenadine (ALLEGRA) 60 MG tablet Take 3 tablets (180 mg total) by mouth daily. 02/07/14   Mercedes Camprubi-Soms, PA-C  fluticasone (FLONASE) 50 MCG/ACT nasal spray Place 2 sprays into both nostrils daily. 04/25/15   Leisa Tapia, PA-C  LORazepam (ATIVAN) 0.5 MG tablet Take 1/2 tablet by mouth twice daily for anxiety 09/22/14   Tiffany L Reed, DO  Phenylephrine-APAP-Guaifenesin (MUCINEX SINUS-MAX PO) Take by mouth as needed.    Historical Provider, MD  sodium chloride (OCEAN) 0.65 % SOLN nasal spray Place 1 spray into both nostrils as needed for congestion. 01/30/14   Kelly Humes, PA-C  Sodium Chloride-Sodium Bicarb (NETI POT SINUS WASH) 2300-700 MG KIT   Place 1 application into the nose 3 (three) times daily as needed (sinus congestion). 02/07/14   Mercedes Camprubi-Soms, PA-C  zolpidem (AMBIEN) 5 MG tablet Take 1 tablet (5 mg total) by mouth at bedtime as needed for sleep. 09/05/14   Monica Carter, DO   BP 121/88 mmHg  Pulse 72  Temp(Src) 98.3 F (36.8 C)  Resp 16  Ht 5' (1.524 m)  Wt 63.504 kg  BMI 27.34 kg/m2  SpO2 100%  LMP 04/24/2015   Physical Exam  Constitutional: She is oriented to person, place, and time. She appears well-developed and well-nourished. No distress.  HENT:  Head: Normocephalic and atraumatic.  Right Ear: Tympanic membrane normal.  Left Ear: Tympanic membrane normal.   Mouth/Throat: Uvula is midline. No oropharyngeal exudate, posterior oropharyngeal edema or posterior oropharyngeal erythema.  Boggy pale nasal mucosa Clear discharge 2-3 + tonsils that were pink. No erythema, no exudate.  Eyes: Conjunctivae and EOM are normal.  Neck: Neck supple. No tracheal deviation present.  Cardiovascular: Normal rate and regular rhythm.   Pulmonary/Chest: Effort normal and breath sounds normal. No respiratory distress. She has no wheezes. She has no rales.  Abdominal: Soft. There is no tenderness.  Musculoskeletal: Normal range of motion.  Lymphadenopathy:    She has no cervical adenopathy.  Neurological: She is alert and oriented to person, place, and time.  Skin: Skin is warm and dry.  Axillary lymph nodes normal No supraclavicular lymph nodes  1 discrete post auricular lymph node, barely palpable  Psychiatric: She has a normal mood and affect. Her behavior is normal.  Nursing note and vitals reviewed.   ED Course  Procedures (including critical care time)  DIAGNOSTIC STUDIES: Oxygen Saturation is 100% on RA, normal by my interpretation.    COORDINATION OF CARE: 12:27 PM-Discussed treatment plan which includes Ativan with pt at bedside and pt agreed to plan.   Labs Review Labs Reviewed - No data to display  Imaging Review No results found.   EKG Interpretation None      MDM   Pt with non-tender lump behind left ear, recent URI sx, hx of seasonal allergies which has recently worsened with weather changes, pt has not restarted tx.  She has hx of anxiety/depression.  Managed with multiple meds, including ativan.  She had a friend who passed away from leukemia, and she had a panic attack, is very tearful, because she noticed a lump behind her ear.    On exam I can barely palpate the left postauricular lymph node.  She has nasal mucosa consistent with seasonal allergies.  Lung CTA, pt has no other palpable lymph nodes.  Ears bilaterally are normal in  appearance.  She was reassured that lymph node is likely reactive to recent URI/seasonal allergies.  She was given her home dose of ativan.  She was encouraged to take her medicine when she feels acutely anxious like this.  Pt had ride home from her mother.  She was discharged in good condition. With stable vital signs.  Final diagnoses:  Allergic rhinitis, unspecified allergic rhinitis type  Anxiety   I personally performed the services described in this documentation, which was scribed in my presence. The recorded information has been reviewed and is accurate.        Delsa Grana, PA-C 04/25/15 2139  Noemi Chapel, MD 04/26/15 [phone]

## 2015-09-19 ENCOUNTER — Emergency Department (HOSPITAL_COMMUNITY): Payer: BC Managed Care – PPO

## 2015-09-19 ENCOUNTER — Encounter (HOSPITAL_COMMUNITY): Payer: Self-pay | Admitting: Emergency Medicine

## 2015-09-19 ENCOUNTER — Emergency Department (HOSPITAL_COMMUNITY)
Admission: EM | Admit: 2015-09-19 | Discharge: 2015-09-19 | Disposition: A | Discharge status: Home / Self Care | Payer: BC Managed Care – PPO | Attending: Emergency Medicine | Admitting: Emergency Medicine

## 2015-09-19 DIAGNOSIS — Z87891 Personal history of nicotine dependence: Secondary | ICD-10-CM | POA: Diagnosis not present

## 2015-09-19 DIAGNOSIS — Y9289 Other specified places as the place of occurrence of the external cause: Secondary | ICD-10-CM | POA: Insufficient documentation

## 2015-09-19 DIAGNOSIS — Y939 Activity, unspecified: Secondary | ICD-10-CM | POA: Diagnosis not present

## 2015-09-19 DIAGNOSIS — S0231XA Fracture of orbital floor, right side, initial encounter for closed fracture: Secondary | ICD-10-CM | POA: Diagnosis not present

## 2015-09-19 DIAGNOSIS — Z7982 Long term (current) use of aspirin: Secondary | ICD-10-CM | POA: Diagnosis not present

## 2015-09-19 DIAGNOSIS — Y999 Unspecified external cause status: Secondary | ICD-10-CM | POA: Insufficient documentation

## 2015-09-19 DIAGNOSIS — R200 Anesthesia of skin: Secondary | ICD-10-CM | POA: Diagnosis not present

## 2015-09-19 DIAGNOSIS — H1132 Conjunctival hemorrhage, left eye: Secondary | ICD-10-CM

## 2015-09-19 DIAGNOSIS — S023XXA Fracture of orbital floor, initial encounter for closed fracture: Secondary | ICD-10-CM

## 2015-09-19 DIAGNOSIS — S0993XA Unspecified injury of face, initial encounter: Secondary | ICD-10-CM | POA: Diagnosis present

## 2015-09-19 MED ORDER — FLUORESCEIN SODIUM 1 MG OP STRP
1.0000 | ORAL_STRIP | Freq: Once | OPHTHALMIC | Status: AC
  Administered 2015-09-19: 1 via OPHTHALMIC
  Filled 2015-09-19: qty 1

## 2015-09-19 MED ORDER — TETRACAINE HCL 0.5 % OP SOLN
2.0000 [drp] | Freq: Once | OPHTHALMIC | Status: AC
Start: 2015-09-19 — End: 2015-09-19
  Administered 2015-09-19: 2 [drp] via OPHTHALMIC
  Filled 2015-09-19: qty 2

## 2015-09-19 MED ORDER — DIAZEPAM 5 MG PO TABS
5.0000 mg | ORAL_TABLET | Freq: Once | ORAL | Status: AC
  Administered 2015-09-19: 5 mg via ORAL
  Filled 2015-09-19: qty 1

## 2015-09-19 MED ORDER — DIAZEPAM 5 MG PO TABS: 5.0000 mg | ORAL_TABLET | Freq: Three times a day (TID) | ORAL | Status: DC | PRN
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
## 2015-09-19 NOTE — ED Notes (Signed)
Taken to CT.

## 2015-09-19 NOTE — Discharge Instructions (Signed)
Read the information below.  Use the prescribed medication as directed.  Please discuss all new medications with your pharmacist.  You may return to the Emergency Department at any time for worsening condition or any new symptoms that concern you.     If you develop worsening pain in your eye, change in your vision, swelling around your eye, difficulty moving your eye, or fevers greater than 100.4, see your eye doctor or return to the Emergency Department immediately for a recheck.      General Assault Assault includes any behavior or physical attack--whether it is on purpose or not--that results in injury to another person, damage to property, or both. This also includes assault that has not yet happened, but is planned to happen. Threats of assault may be physical, verbal, or written. They may be said or sent by:  Mail.  E-mail.  Text.  Social media.  Fax. The threats may be direct, implied, or understood. WHAT ARE THE DIFFERENT FORMS OF ASSAULT? Forms of assault include:  Physically assaulting a person. This includes physical threats to inflict physical harm as well as:  Slapping.  Hitting.  Poking.  Kicking.  Punching.  Pushing.  Sexually assaulting a person. Sexual assault is any sexual activity that a person is forced, threatened, or coerced to participate in. It may or may not involve physical contact with the person who is assaulting you. You are sexually assaulted if you are forced to have sexual contact of any kind.  Damaging or destroying a person's assistive equipment, such as glasses, canes, or walkers.  Throwing or hitting objects.  Using or displaying a weapon to harm or threaten someone.  Using or displaying an object that appears to be a weapon in a threatening manner.  Using greater physical size or strength to intimidate someone.  Making intimidating or threatening gestures.  Bullying.  Hazing.  Using language that is intimidating, threatening,  hostile, or abusive.  Stalking.  Restraining someone with force. WHAT SHOULD I DO IF I EXPERIENCE ASSAULT?  Report assaults, threats, and stalking to the police. Call your local emergency services (911 in the [place] if you are in immediate danger or you need medical help.  You can work with a Clinical research associate or an advocate to get legal protection against someone who has assaulted you or threatened you with assault. Protection includes restraining orders and private addresses. Crimes against you, such as assault, can also be prosecuted through the courts. Laws will vary depending on where you live.   This information is not intended to replace advice given to you by your health care provider. Make sure you discuss any questions you have with your health care provider.   Document Released: 02/17/2005 Document Revised: 03/10/2014 Document Reviewed: 11/04/2013 Elsevier Interactive Patient Education 2016 Else[employer] Inc[employer]  Orbital Floor Fracture, Non-Blowout The eye sits in the part of the skull called the "orbit." The upper and outside walls of the orbit are thick and strong. The inside wall (near the nose) and the orbital floor are very thin and weak. The tissues around the eye will briefly press together if there is a direct blow to the front of the eye. This leads to high pressure against the orbital walls. The inside wall and the orbital floor may break since these are the weakest walls. If the orbital floor breaks, the tissues around the eye, including the muscle that makes the eye look down, may become trapped in the sinus below when the orbital floor "blows out." If  a blowout does not happen, the orbital floor fracture is considered a non-blowout orbital fracture. CAUSES An orbital floor fracture can be caused by any accident in which an object hits the face or the face strikes against a hard object. The most common ways that people break their eye socket include:  Being hit by a blunt object, such as a  baseball bat or a fist.  Striking the face on the car dashboard during a crash.  Falls.  Gunshot. SYMPTOMS  If there has been no injury to the eye itself, symptoms may include:  Puffiness (swelling) and bruising around the eye area (black eye).  Numbness of the cheek and upper gum on the side with the floor fracture. This is caused by nerve injury to these areas.  Pain around the eye.  Headache.  Ear pain on the injured side. DIAGNOSIS The diagnosis of an orbital floor fracture is suspected during an eye exam by an ophthalmologist. It is confirmed by X-rays or CT scan. TREATMENT Your caregiver may suggest waiting 1 or 2 weeks for the swelling to go away before examining the eye. When the swelling lessens, your caregiver will examine the eye to see if there is any sign of a trapped muscle or double vision when looking in different directions. If double vision is not found and muscle or tissue did not get trapped, no further treatment is necessary. After that, in almost all cases, the bones heal together on their own.  HOME CARE INSTRUCTIONS  Take all pain medicine as directed by your caregiver.  Use ice packs or other cold therapy to reduce swelling as directed by your caregiver.  Do not put a contact lens in the injured eye until your caregiver approves.  Avoid dusty environments.  Always wear protective glasses or goggles when recommended. Wearing protective eyewear is not dangerous to your injured eye and will not delay healing.  As long as your other eye is seeing normally, you may return to work and drive.  You may travel by plane or be in high altitudes. However, your swelling may take longer to go away, and you may have sinus pain.  Be aware that your depth perception and your ability to judge distance may be reduced or lost. SEEK IMMEDIATE MEDICAL CARE IF:  Your vision changes.  Your redness or swelling persists around the injured eye or gets worse.  You start to  have double vision.  You have a bloody or discolored discharge from your nose.  You have a fever that lasts longer than 2 to 3 days.  You have a fever that suddenly gets worse.  Your cheek or upper gum numbness does not go away. MAKE SURE YOU:  Understand these instructions.  Will watch your condition.  Will get help right away if you are not doing well or get worse.   This information is not intended to replace advice given to you by your health care provider. Make sure you discuss any questions you have with your health care provider.   Document Released: 05/12/2011 Document Revised: 03/10/2014 Document Reviewed: 05/12/2011 Elsevier Interactive Patient Education 2016 Elsevier Inc.  Subconjunctival Hemorrhage Subconjunctival hemorrhage is bleeding that happens between the white part of your eye (sclera) and the clear membrane that covers the outside of your eye (conjunctiva). There are many tiny blood vessels near the surface of your eye. A subconjunctival hemorrhage happens when one or more of these vessels breaks and bleeds, causing a red patch to appear on your  eye. This is similar to a bruise. Depending on the amount of bleeding, the red patch may only cover a small area of your eye or it may cover the entire visible part of the sclera. If a lot of blood collects under the conjunctiva, there may also be swelling. Subconjunctival hemorrhages do not affect your vision or cause pain, but your eye may feel irritated if there is swelling. Subconjunctival hemorrhages usually do not require treatment, and they disappear on their own within two weeks. CAUSES This condition may be caused by:  Mild trauma, such as rubbing your eye too hard.  Severe trauma or blunt injuries.  Coughing, sneezing, or vomiting.  Straining, such as when lifting a heavy object.  High blood pressure.  Recent eye surgery.  A history of diabetes.  Certain medicines, especially blood thinners  (anticoagulants).  Other conditions, such as eye tumors, bleeding disorders, or blood vessel abnormalities. Subconjunctival hemorrhages can happen without an obvious cause.  SYMPTOMS  Symptoms of this condition include:  A bright red or dark red patch on the white part of the eye.  The red area may spread out to cover a larger area of the eye before it goes away.  The red area may turn brownish-yellow before it goes away.  Swelling.  Mild eye irritation. DIAGNOSIS This condition is diagnosed with a physical exam. If your subconjunctival hemorrhage was caused by trauma, your health care provider may refer you to an eye specialist (ophthalmologist) or another specialist to check for other injuries. You may have other tests, including:  An eye exam.  A blood pressure check.  Blood tests to check for bleeding disorders. If your subconjunctival hemorrhage was caused by trauma, X-rays or a CT scan may be done to check for other injuries. TREATMENT Usually, no treatment is needed. Your health care provider may recommend eye drops or cold compresses to help with discomfort. HOME CARE INSTRUCTIONS  Take over-the-counter and prescription medicines only as directed by your health care provider.  Use eye drops or cold compresses to help with discomfort as directed by your health care provider.  Avoid activities, things, and environments that may irritate or injure your eye.  Keep all follow-up visits as told by your health care provider. This is important. SEEK MEDICAL CARE IF:  You have pain in your eye.  The bleeding does not go away within 3 weeks.  You keep getting new subconjunctival hemorrhages. SEEK IMMEDIATE MEDICAL CARE IF:  Your vision changes or you have difficulty seeing.  You suddenly develop severe sensitivity to light.  You develop a severe headache, persistent vomiting, confusion, or abnormal tiredness (lethargy).  Your eye seems to bulge or protrude from your  eye socket.  You develop unexplained bruises on your body.  You have unexplained bleeding in another area of your body.   This information is not intended to replace advice given to you by your health care provider. Make sure you discuss any questions you have with your health care provider.   Document Released: 02/17/2005 Document Revised: 11/08/2014 Document Reviewed: 04/26/2014 Elsevier Interactive Patient Education [employer]

## 2015-09-19 NOTE — ED Notes (Signed)
Patient unable to provide urine sample at this time

## 2017-02-28 ENCOUNTER — Other Ambulatory Visit: Payer: Self-pay

## 2017-02-28 ENCOUNTER — Emergency Department (HOSPITAL_COMMUNITY)
Admission: EM | Admit: 2017-02-28 | Discharge: 2017-02-28 | Disposition: A | Discharge status: Home / Self Care | Payer: BC Managed Care – PPO | Attending: Emergency Medicine | Admitting: Emergency Medicine

## 2017-02-28 ENCOUNTER — Encounter (HOSPITAL_COMMUNITY): Payer: Self-pay | Admitting: *Deleted

## 2017-02-28 DIAGNOSIS — Z79899 Other long term (current) drug therapy: Secondary | ICD-10-CM | POA: Insufficient documentation

## 2017-02-28 DIAGNOSIS — Z87891 Personal history of nicotine dependence: Secondary | ICD-10-CM | POA: Diagnosis not present

## 2017-02-28 DIAGNOSIS — F410 Panic disorder [episodic paroxysmal anxiety] without agoraphobia: Secondary | ICD-10-CM | POA: Diagnosis not present

## 2017-02-28 DIAGNOSIS — F419 Anxiety disorder, unspecified: Secondary | ICD-10-CM | POA: Diagnosis not present

## 2017-02-28 MED ORDER — NAPROXEN 500 MG PO TABS: 500.0000 mg | ORAL_TABLET | Freq: Two times a day (BID) | ORAL | 0 refills | Status: DC

## 2017-02-28 NOTE — ED Notes (Signed)
Declined W/C at D/C and was escorted to lobby by RN. 

## 2017-02-28 NOTE — Discharge Instructions (Signed)
Please read attached information regarding your condition. Take anti-inflammatory scheduled for the next 3-4 days. Apply heating pad and stretch area as tolerated. Follow-up with your primary care provider for further evaluation. Return to ED for worsening symptoms, chest pain, trouble breathing, coughing up blood, leg swelling.

## 2017-02-28 NOTE — ED Provider Notes (Signed)
[facility] EMERGENCY DEPARTMENT Provider Note   CSN: [id number] Arrival date & time: 02/28/17  1651     History   Chief Complaint Chief Complaint  Patient presents with  . Anxiety  . chest knot    HPI Brandi Lucero is a 31 y.o. female with a past medical history of anxiety, who presents to ED for evaluation of painless knot on the right side of her chest.  She also reports soreness in bilateral arms.  She noticed the knot last week after she was shoveling snow and ice out of her driveway.  She states that she has been "up for 3 nights in a row just thinking about it."  She states that "my anxiety always causes me to imagine the worst possible scenario."  She has not taking any medications or other measures to help with the symptoms.  She states that "sometimes the bump goes away but it does not hurt when you push on it."  She has not taken any medications for her she states that "I do not think I need it anymore."  She denies any chest pain, shortness of breath, wheezing, hemoptysis, prior MI, DVT or PE, family history of cardiac disease at a young age, URI symptoms, cough, fever, injury or trauma to area.  HPI  Past Medical History:  Diagnosis Date  . Anxiety   . Depression   . Ectopic pregnancy   . IBS (irritable bowel syndrome) 2009    Patient Active Problem List   Diagnosis Date Noted  . Allergic rhinitis 03/01/2014  . Bipolar 2 disorder ([place]) 02/10/2014  . Anxiety 02/10/2014    Past Surgical History:  Procedure Laterality Date  . DILATION AND EVACUATION  03/03/2012  . ECTOPIC PREGNANCY SURGERY  03/03/2004  . LAPAROSCOPY FOR ECTOPIC PREGNANCY  03/03/2004  . WISDOM TOOTH EXTRACTION      OB History    Gravida Para Term Preterm AB Living   3       2     SAB TAB Ectopic Multiple Live Births   1   1           Home Medications    Prior to Admission medications   Medication Sig Start Date End Date Taking? Authorizing Provider  cetirizine  (ZYRTEC ALLERGY) 10 MG tablet Take 1 tablet (10 mg total) by mouth at bedtime. Patient not taking: Reported on 09/19/2015 04/25/15   Delsa Grana, PA-C  diazepam (VALIUM) 5 MG tablet Take 1 tablet (5 mg total) by mouth every 8 (eight) hours as needed for anxiety or muscle spasms (or pain). 09/19/15   Clayton Bibles, PA-C  fexofenadine (ALLEGRA) 60 MG tablet Take 3 tablets (180 mg total) by mouth daily. Patient not taking: Reported on 09/19/2015 02/07/14   Street, [place], PA-C  fluticasone [facility]) 50 MCG/ACT nasal spray Place 2 sprays into both nostrils daily. Patient not taking: Reported on 09/19/2015 04/25/15   Delsa Grana, PA-C  LORazepam (ATIVAN) 0.5 MG tablet Take 1/2 tablet by mouth twice daily for anxiety Patient not taking: Reported on 09/19/2015 09/22/14   Hollace Kinnier L, DO  naproxen (NAPROSYN) 500 MG tablet Take 1 tablet (500 mg total) by mouth 2 (two) times daily. 02/28/17   Gabreille Dardis, PA-C  sodium chloride (OCEAN) 0.65 % SOLN nasal spray Place 1 spray into both nostrils as needed for congestion. Patient not taking: Reported on 09/19/2015 01/30/14   Antonietta Breach, PA-C  Sodium Chloride-Sodium Bicarb (NETI POT SINUS [place]) 2300-700 MG KIT Place  1 application into the nose 3 (three) times daily as needed (sinus congestion). Patient not taking: Reported on 09/19/2015 02/07/14   Street, [place], PA-C  zolpidem (AMBIEN) 5 MG tablet Take 1 tablet (5 mg total) by mouth at bedtime as needed for sleep. Patient not taking: Reported on 09/19/2015 09/05/14   Gildardo Cranker, DO    Family History Family History  Problem Relation Age of Onset  . Colon cancer Father   . High blood pressure Mother     Social History Social History   Tobacco Use  . Smoking status: Former Smoker    Packs/day: 0.50    Types: Cigarettes  . Smokeless tobacco: Never Used  Substance Use Topics  . Alcohol use: Yes    Alcohol/week: 1.2 oz    Types: 2 Standard drinks or equivalent per week  . Drug use: No      Allergies   Vicodin [hydrocodone-acetaminophen]   Review of Systems Review of Systems  Constitutional: Negative for appetite change, chills and fever.  HENT: Negative for ear pain, rhinorrhea, sneezing and sore throat.   Eyes: Negative for photophobia and visual disturbance.  Respiratory: Negative for cough, chest tightness, shortness of breath and wheezing.   Cardiovascular: Negative for chest pain and palpitations.  Gastrointestinal: Negative for abdominal pain, blood in stool, constipation, diarrhea, nausea and vomiting.  Genitourinary: Negative for dysuria, hematuria and urgency.  Musculoskeletal: Negative for myalgias.  Skin: Negative for rash.       + Bump on right side of chest wall  Neurological: Negative for dizziness, weakness and light-headedness.     Physical Exam Updated Vital Signs BP (!) 121/91 (BP Location: Right Arm)   Pulse 90   Temp 98 F (36.7 C) (Oral)   Resp 18   SpO2 100%   Physical Exam  Constitutional: She appears well-developed and well-nourished. No distress.  Nontoxic-appearing and in no acute distress.  Does appear very anxious, panicked and worried.  HENT:  Head: Normocephalic and atraumatic.  Nose: Nose normal.  Eyes: Conjunctivae and EOM are normal. Left eye exhibits no discharge. No scleral icterus.  Neck: Normal range of motion. Neck supple.  Cardiovascular: Regular rhythm, normal heart sounds and intact distal pulses. Exam reveals no gallop and no friction rub.  No murmur heard. Pulmonary/Chest: Effort normal and breath sounds normal. No respiratory distress. She exhibits no tenderness.  No chest tenderness to palpation or induration noted.  Abdominal: Soft. Bowel sounds are normal. She exhibits no distension. There is no tenderness. There is no guarding.  Musculoskeletal: Normal range of motion. She exhibits no edema.  Full active and passive range of motion of bilateral arms.  No calf tenderness noted bilaterally.  Neurological:  She is alert. She exhibits normal muscle tone. Coordination normal.  Skin: Skin is warm and dry. No rash noted.  Psychiatric: Her mood appears anxious.  Nursing note and vitals reviewed.    ED Treatments / Results  Labs (all labs ordered are listed, but only abnormal results are displayed) Labs Reviewed - No data to display  EKG  EKG Interpretation None       Radiology No results found.  Procedures Procedures (including critical care time)  Medications Ordered in ED Medications - No data to display   Initial Impression / Assessment and Plan / ED Course  I have reviewed the triage vital signs and the nursing notes.  Pertinent labs & imaging results that were available during my care of the patient were reviewed by me and considered in  my medical decision making (see chart for details).     Patient, with a past medical history of anxiety, who presents to ED for evaluation of painless knot 2 right-sided chest for the past 2 weeks.  She states that symptoms began after shoveling heavy snow and ice from her driveway.  States that she also has associated bilateral arm soreness.  Throughout my encounter with her, she told me numerous times that because of her anxiety, she gets worried that she has a type of terminal illness or worse case scenario for her symptoms.  She has not taken any medications to help with her symptoms.  She has no chest tenderness to palpation or induration palpated during my examination.  She denies any chest pain, hemoptysis, shortness of breath or any PE risk factors.  Her heart rate did improve during our discussion.  She has full active and passive range of motion of bilateral arms with no wounds or weakness noted.  She is overall well-appearing.  I did have quite a lengthy discussion with her reassuring her that her symptoms are most likely due to a muscle strain.  I did tell her that she should follow-up with her primary care provider if she feels that she  needs to be on any medications for to help with her anxiety.  Patient agrees that "I think this is all because of my anxiety."  I have low suspicion for cardiac or pulmonary cause of her symptoms or any life-threatening illness at this time.  She is satting at 100% on room air.  She is afebrile.  Her vital signs are within normal limits.  Will give patient anti-inflammatories as well as instructions on heat therapy and stretching.  Patient appears stable for discharge at this time.  Strict return precautions given.  Final Clinical Impressions(s) / ED Diagnoses   Final diagnoses:  Anxiety  Panic attack    ED Discharge Orders        Ordered    naproxen (NAPROSYN) 500 MG tablet  2 times daily     02/28/17 1848     Portions of this note were generated with [employer]. Dictation errors may occur despite best attempts at proofreading.    Delia Heady, PA-C 02/28/17 1853    Malvin Johns, MD 02/28/17 [phone]

## 2017-02-28 NOTE — ED Triage Notes (Signed)
Pt states she was shoveling snow last week. She then noticed her chest and arms were sore. Now anxious that she may have a knot on her chest. No knot palpated. No sob. No cp.

## 2018-07-22 ENCOUNTER — Emergency Department (HOSPITAL_COMMUNITY)
Admission: EM | Admit: 2018-07-22 | Discharge: 2018-07-22 | Disposition: A | Discharge status: Home / Self Care | Payer: BC Managed Care – PPO | Attending: Emergency Medicine | Admitting: Emergency Medicine

## 2018-07-22 ENCOUNTER — Other Ambulatory Visit: Payer: Self-pay

## 2018-07-22 DIAGNOSIS — R1033 Periumbilical pain: Secondary | ICD-10-CM | POA: Diagnosis not present

## 2018-07-22 DIAGNOSIS — R110 Nausea: Secondary | ICD-10-CM | POA: Diagnosis not present

## 2018-07-22 DIAGNOSIS — R300 Dysuria: Secondary | ICD-10-CM | POA: Diagnosis present

## 2018-07-22 DIAGNOSIS — Z87891 Personal history of nicotine dependence: Secondary | ICD-10-CM | POA: Insufficient documentation

## 2018-07-22 DIAGNOSIS — A599 Trichomoniasis, unspecified: Secondary | ICD-10-CM | POA: Diagnosis not present

## 2018-07-22 LAB — URINALYSIS, ROUTINE W REFLEX MICROSCOPIC
Glucose, UA: NEGATIVE mg/dL
Ketones, ur: 40 mg/dL — AB
Nitrite: NEGATIVE
Protein, ur: 30 mg/dL — AB
Specific Gravity, Urine: 1.02 (ref 1.005–1.030)
pH: 6.5 (ref 5.0–8.0)

## 2018-07-22 LAB — URINALYSIS, MICROSCOPIC (REFLEX)

## 2018-07-22 LAB — PREGNANCY, URINE: Preg Test, Ur: NEGATIVE

## 2018-07-22 MED ORDER — METRONIDAZOLE 500 MG PO TABS: 500.0000 mg | ORAL_TABLET | Freq: Two times a day (BID) | ORAL | 0 refills | Status: DC

## 2018-07-22 MED ORDER — CEPHALEXIN 500 MG PO CAPS: 500.0000 mg | ORAL_CAPSULE | Freq: Two times a day (BID) | ORAL | 0 refills | Status: DC

## 2018-07-22 MED ORDER — IBUPROFEN 800 MG PO TABS
800.0000 mg | ORAL_TABLET | Freq: Once | ORAL | Status: AC
  Administered 2018-07-22: 04:00:00 800 mg via ORAL
  Filled 2018-07-22: qty 1

## 2018-07-22 NOTE — ED Provider Notes (Signed)
[facility] EMERGENCY DEPARTMENT Provider Note   CSN: [id number] Arrival date & time: 07/22/18  0258    History   Chief Complaint Chief Complaint  Patient presents with  . Dysuria    HPI Brandi Lucero is a 33 y.o. female.     Patient presents to the emergency department with a chief complaint of dysuria.  She reports associated urinary frequency and suprapubic abdominal pressure.  She has been having the symptoms for the past day or so.  She denies any fevers or chills.  States that she has had some mild nausea.  Denies any flank pain.  Denies any vaginal discharge or bleeding.  Denies any other associated symptoms.  Denies any treatments prior to arrival.  The history is provided by the patient. No language interpreter was used.    Past Medical History:  Diagnosis Date  . Anxiety   . Depression   . Ectopic pregnancy   . IBS (irritable bowel syndrome) 2009    Patient Active Problem List   Diagnosis Date Noted  . Allergic rhinitis 03/01/2014  . Bipolar 2 disorder ([place]) 02/10/2014  . Anxiety 02/10/2014    Past Surgical History:  Procedure Laterality Date  . DILATION AND EVACUATION  03/03/2012  . ECTOPIC PREGNANCY SURGERY  03/03/2004  . LAPAROSCOPY FOR ECTOPIC PREGNANCY  03/03/2004  . WISDOM TOOTH EXTRACTION       OB History    Gravida  3   Para      Term      Preterm      AB  2   Living        SAB  1   TAB      Ectopic  1   Multiple      Live Births               Home Medications    Prior to Admission medications   Medication Sig Start Date End Date Taking? Authorizing Provider  cetirizine (ZYRTEC ALLERGY) 10 MG tablet Take 1 tablet (10 mg total) by mouth at bedtime. Patient not taking: Reported on 09/19/2015 04/25/15   Delsa Grana, PA-C  diazepam (VALIUM) 5 MG tablet Take 1 tablet (5 mg total) by mouth every 8 (eight) hours as needed for anxiety or muscle spasms (or pain). 09/19/15   Clayton Bibles, PA-C  fexofenadine  (ALLEGRA) 60 MG tablet Take 3 tablets (180 mg total) by mouth daily. Patient not taking: Reported on 09/19/2015 02/07/14   Street, [place], PA-C  fluticasone [facility]) 50 MCG/ACT nasal spray Place 2 sprays into both nostrils daily. Patient not taking: Reported on 09/19/2015 04/25/15   Delsa Grana, PA-C  LORazepam (ATIVAN) 0.5 MG tablet Take 1/2 tablet by mouth twice daily for anxiety Patient not taking: Reported on 09/19/2015 09/22/14   Hollace Kinnier L, DO  naproxen (NAPROSYN) 500 MG tablet Take 1 tablet (500 mg total) by mouth 2 (two) times daily. 02/28/17   Khatri, [place], PA-C  sodium chloride (OCEAN) 0.65 % SOLN nasal spray Place 1 spray into both nostrils as needed for congestion. Patient not taking: Reported on 09/19/2015 01/30/14   Antonietta Breach, PA-C  Sodium Chloride-Sodium Bicarb (NETI POT SINUS [place]) 2300-700 MG KIT Place 1 application into the nose 3 (three) times daily as needed (sinus congestion). Patient not taking: Reported on 09/19/2015 02/07/14   Street, [place], PA-C  zolpidem (AMBIEN) 5 MG tablet Take 1 tablet (5 mg total) by mouth at bedtime as needed for sleep. Patient not taking: Reported  on 09/19/2015 09/05/14   Gildardo Cranker, DO    Family History Family History  Problem Relation Age of Onset  . Colon cancer Father   . High blood pressure Mother     Social History Social History   Tobacco Use  . Smoking status: Former Smoker    Packs/day: 0.50    Types: Cigarettes  . Smokeless tobacco: Never Used  Substance Use Topics  . Alcohol use: Yes    Alcohol/week: 2.0 standard drinks    Types: 2 Standard drinks or equivalent per week  . Drug use: No     Allergies   Vicodin [hydrocodone-acetaminophen]   Review of Systems Review of Systems  All other systems reviewed and are negative.    Physical Exam Updated Vital Signs BP (!) 129/92 (BP Location: Right Arm)   Pulse (!) 118   Temp 98.8 F (37.1 C) (Oral)   Resp (!) 165   Ht 5' (1.524 m)   Wt 61.2 kg   SpO2  97%   BMI 26.37 kg/m   Physical Exam Vitals signs and nursing note reviewed.  Constitutional:      General: She is not in acute distress.    Appearance: She is well-developed.  HENT:     Head: Normocephalic and atraumatic.  Eyes:     Conjunctiva/sclera: Conjunctivae normal.  Neck:     Musculoskeletal: Neck supple.  Cardiovascular:     Rate and Rhythm: Normal rate and regular rhythm.     Heart sounds: No murmur.  Pulmonary:     Effort: Pulmonary effort is normal. No respiratory distress.     Breath sounds: Normal breath sounds.  Abdominal:     Palpations: Abdomen is soft.     Tenderness: There is no abdominal tenderness.  Musculoskeletal: Normal range of motion.  Skin:    General: Skin is warm and dry.  Neurological:     Mental Status: She is alert and oriented to person, place, and time.  Psychiatric:        Mood and Affect: Mood normal.        Behavior: Behavior normal.      ED Treatments / Results  Labs (all labs ordered are listed, but only abnormal results are displayed) Labs Reviewed  URINALYSIS, ROUTINE W REFLEX MICROSCOPIC  PREGNANCY, URINE    EKG None  Radiology No results found.  Procedures Procedures (including critical care time)  Medications Ordered in ED Medications - No data to display   Initial Impression / Assessment and Plan / ED Course  I have reviewed the triage vital signs and the nursing notes.  Pertinent labs & imaging results that were available during my care of the patient were reviewed by me and considered in my medical decision making (see chart for details).        Patient with dysuria, urinary frequency, and suprapubic abdominal pain.  Symptoms are consistent with UTI.  Will check urinalysis.  Urine pregnancy test negative.  Urinalysis is consistent with UTI.  Incidentally, there is trichomonas found, we will treat her for this as well.  Discharged home.  Return precautions given.  Final Clinical Impressions(s) / ED  Diagnoses   Final diagnoses:  Dysuria  Trichomonas infection    ED Discharge Orders         Ordered    metroNIDAZOLE (FLAGYL) 500 MG tablet  2 times daily     07/22/18 0402    cephALEXin (KEFLEX) 500 MG capsule  2 times daily     07/22/18  [address]           Montine Circle, PA-C 41/03/01 3143    Delora Fuel, MD 88/87/57 [phone]

## 2018-11-25 ENCOUNTER — Other Ambulatory Visit: Payer: Self-pay

## 2018-11-25 ENCOUNTER — Emergency Department (HOSPITAL_COMMUNITY): Payer: BC Managed Care – PPO

## 2018-11-25 ENCOUNTER — Encounter (HOSPITAL_COMMUNITY): Payer: Self-pay

## 2018-11-25 ENCOUNTER — Emergency Department (HOSPITAL_COMMUNITY)
Admission: EM | Admit: 2018-11-25 | Discharge: 2018-11-25 | Disposition: A | Discharge status: Home / Self Care | Payer: BC Managed Care – PPO | Attending: Emergency Medicine | Admitting: Emergency Medicine

## 2018-11-25 DIAGNOSIS — Z87891 Personal history of nicotine dependence: Secondary | ICD-10-CM | POA: Diagnosis not present

## 2018-11-25 DIAGNOSIS — Y93K1 Activity, walking an animal: Secondary | ICD-10-CM | POA: Diagnosis not present

## 2018-11-25 DIAGNOSIS — Y9289 Other specified places as the place of occurrence of the external cause: Secondary | ICD-10-CM | POA: Insufficient documentation

## 2018-11-25 DIAGNOSIS — S93402A Sprain of unspecified ligament of left ankle, initial encounter: Secondary | ICD-10-CM | POA: Insufficient documentation

## 2018-11-25 DIAGNOSIS — M25572 Pain in left ankle and joints of left foot: Secondary | ICD-10-CM

## 2018-11-25 DIAGNOSIS — S99912A Unspecified injury of left ankle, initial encounter: Secondary | ICD-10-CM | POA: Diagnosis present

## 2018-11-25 DIAGNOSIS — Y999 Unspecified external cause status: Secondary | ICD-10-CM | POA: Diagnosis not present

## 2018-11-25 DIAGNOSIS — X500XXA Overexertion from strenuous movement or load, initial encounter: Secondary | ICD-10-CM | POA: Diagnosis not present

## 2018-11-25 HISTORY — DX: Bipolar disorder, unspecified: F31.9

## 2018-11-25 NOTE — Discharge Instructions (Addendum)

## 2018-11-25 NOTE — ED Notes (Signed)
Discharge instructions discussed with Pt. Pt verbalized understanding. Pt stable and ambulatory.    

## 2018-11-25 NOTE — ED Provider Notes (Signed)
[facility] EMERGENCY DEPARTMENT Provider Note   CSN: [id number] Arrival date & time: 11/25/18  9924     History   Chief Complaint Chief Complaint  Patient presents with  . Ankle Pain    HPI Brandi Lucero is a 33 y.o. female with a past medical history of bipolar, depression, anxiety, IBS, who presents today for evaluation of ankle pain.  She reports that she was walking her dog at this morning up a hill when she stepped into a hole rolling her left ankle.  She has not taken any meds prior to arrival.  She denies any other injuries.  Did not strike her head.  She has never injured this ankle before.       HPI  Past Medical History:  Diagnosis Date  . Anxiety   . Bipolar 1 disorder ([place])   . Depression   . Ectopic pregnancy   . IBS (irritable bowel syndrome) 2009    Patient Active Problem List   Diagnosis Date Noted  . Allergic rhinitis 03/01/2014  . Bipolar 2 disorder ([place]) 02/10/2014  . Anxiety 02/10/2014    Past Surgical History:  Procedure Laterality Date  . DILATION AND EVACUATION  03/03/2012  . ECTOPIC PREGNANCY SURGERY  03/03/2004  . LAPAROSCOPY FOR ECTOPIC PREGNANCY  03/03/2004  . WISDOM TOOTH EXTRACTION       OB History    Gravida  3   Para      Term      Preterm      AB  2   Living        SAB  1   TAB      Ectopic  1   Multiple      Live Births               Home Medications    Prior to Admission medications   Medication Sig Start Date End Date Taking? Authorizing Provider  cephALEXin (KEFLEX) 500 MG capsule Take 1 capsule (500 mg total) by mouth 2 (two) times daily. 07/22/18   Montine Circle, PA-C  cetirizine (ZYRTEC ALLERGY) 10 MG tablet Take 1 tablet (10 mg total) by mouth at bedtime. Patient not taking: Reported on 09/19/2015 04/25/15   Delsa Grana, PA-C  diazepam (VALIUM) 5 MG tablet Take 1 tablet (5 mg total) by mouth every 8 (eight) hours as needed for anxiety or muscle spasms (or pain). 09/19/15    Clayton Bibles, PA-C  fexofenadine (ALLEGRA) 60 MG tablet Take 3 tablets (180 mg total) by mouth daily. Patient not taking: Reported on 09/19/2015 02/07/14   Street, [place], PA-C  fluticasone [facility]) 50 MCG/ACT nasal spray Place 2 sprays into both nostrils daily. Patient not taking: Reported on 09/19/2015 04/25/15   Delsa Grana, PA-C  LORazepam (ATIVAN) 0.5 MG tablet Take 1/2 tablet by mouth twice daily for anxiety Patient not taking: Reported on 09/19/2015 09/22/14   Hollace Kinnier L, DO  metroNIDAZOLE (FLAGYL) 500 MG tablet Take 1 tablet (500 mg total) by mouth 2 (two) times daily. 07/22/18   Montine Circle, PA-C  naproxen (NAPROSYN) 500 MG tablet Take 1 tablet (500 mg total) by mouth 2 (two) times daily. 02/28/17   Khatri, Hina, PA-C  sodium chloride (OCEAN) 0.65 % SOLN nasal spray Place 1 spray into both nostrils as needed for congestion. Patient not taking: Reported on 09/19/2015 01/30/14   Antonietta Breach, PA-C  Sodium Chloride-Sodium Bicarb (NETI POT SINUS [place]) 2300-700 MG KIT Place 1 application into the nose 3 (three)  times daily as needed (sinus congestion). Patient not taking: Reported on 09/19/2015 02/07/14   Street, [place], PA-C  zolpidem (AMBIEN) 5 MG tablet Take 1 tablet (5 mg total) by mouth at bedtime as needed for sleep. Patient not taking: Reported on 09/19/2015 09/05/14   Gildardo Cranker, DO    Family History Family History  Problem Relation Age of Onset  . Colon cancer Father   . High blood pressure Mother     Social History Social History   Tobacco Use  . Smoking status: Former Smoker    Packs/day: 0.50    Types: Cigarettes  . Smokeless tobacco: Never Used  Substance Use Topics  . Alcohol use: Yes    Alcohol/week: 2.0 standard drinks    Types: 2 Standard drinks or equivalent per week  . Drug use: No     Allergies   Vicodin [hydrocodone-acetaminophen]   Review of Systems Review of Systems  Constitutional: Negative for chills and fever.  Musculoskeletal:  Positive for joint swelling.  Neurological: Negative for weakness, numbness and headaches.  All other systems reviewed and are negative.    Physical Exam Updated Vital Signs BP 104/78 (BP Location: Right Arm)   Pulse 90   Temp 98.6 F (37 C) (Oral)   Resp 20   Ht 5' (1.524 m)   Wt 63.5 kg   LMP 10/25/2018 (Within Days)   SpO2 97%   BMI 27.34 kg/m   Physical Exam Vitals signs and nursing note reviewed.  Constitutional:      General: She is not in acute distress.    Appearance: She is not ill-appearing.  HENT:     Head: Normocephalic.  Cardiovascular:     Rate and Rhythm: Normal rate.     Comments: 2+ left DP pulse.  Unable to tolerate palpation for PT pulse. Pulmonary:     Effort: Pulmonary effort is normal. No respiratory distress.  Musculoskeletal:     Comments: Left ankle has mild edema anterior laterally.  This area is diffusely tender to palpation.  No crepitus or deformities.    Skin:    Comments: No lacerations, ecchymosis or wounds present over the left ankle/foot.  Neurological:     Mental Status: She is alert.     Comments: Sensation intact to light touch to left foot.      ED Treatments / Results  Labs (all labs ordered are listed, but only abnormal results are displayed) Labs Reviewed - No data to display  EKG None  Radiology Dg Ankle Complete Left  Result Date: 11/25/2018 CLINICAL DATA:  Left ankle pain and swelling EXAM: LEFT ANKLE COMPLETE - 3+ VIEW COMPARISON:  None. FINDINGS: There is no evidence of fracture, dislocation, or joint effusion. There is no evidence of arthropathy or other focal bone abnormality. Soft tissues are unremarkable. IMPRESSION: Negative. Electronically Signed   By: Davina Poke M.D.   On: 11/25/2018 10:48    Procedures Procedures (including critical care time)  Medications Ordered in ED Medications - No data to display   Initial Impression / Assessment and Plan / ED Course  I have reviewed the triage vital  signs and the nursing notes.  Pertinent labs & imaging results that were available during my care of the patient were reviewed by me and considered in my medical decision making (see chart for details).       Presents with left ankle pain after stepping in a hole consistent with a ankle sprain/strain.  The affected ankle is tender on the anteriolateral  aspect.  X-rays were obtained with out acute abnormality. The skin is intact to ankle/foot.  The foot is warm and well perfused with intact sensation.  Motor function is limited secondary to pain.  Patient given instructions for OTC pain medication, ACE wrap and crutches.  Patient advised to follow up with PCP if symptoms persist for longer than one week.  Patient was given the option to ask questions, all of which were answered to the best of my ability.  Patient is agreeable for discharge.    Final Clinical Impressions(s) / ED Diagnoses   Final diagnoses:  Acute left ankle pain    ED Discharge Orders    None       Lorin Glass, Hershal Coria 11/25/18 [place], [place], DO 11/25/18 1541

## 2018-11-25 NOTE — ED Triage Notes (Signed)
Pt states she was walking her dog this morning when she tripped in a hole, c.o left ankle pain, no deformity noted, pt ambulatory.

## 2018-11-25 NOTE — Progress Notes (Signed)
Orthopedic Tech Progress Note Patient Details:  Brandi Lucero 1985-10-21 [id number]  Ortho Devices Type of Ortho Device: Ace wrap, Crutches Ortho Device/Splint Interventions: Application   Post Interventions Patient Tolerated: Well Instructions Provided: Care of device   [place] Pink 11/25/2018, 11:56 AM

## 2019-06-22 LAB — OB RESULTS CONSOLE HEPATITIS B SURFACE ANTIGEN: Hepatitis B Surface Ag: NEGATIVE

## 2019-12-06 ENCOUNTER — Other Ambulatory Visit: Payer: Self-pay

## 2019-12-06 ENCOUNTER — Emergency Department (HOSPITAL_COMMUNITY)
Admission: EM | Admit: 2019-12-06 | Discharge: 2019-12-06 | Disposition: A | Discharge status: Home / Self Care | Payer: BC Managed Care – PPO | Attending: Emergency Medicine | Admitting: Emergency Medicine

## 2019-12-06 ENCOUNTER — Encounter (HOSPITAL_COMMUNITY): Payer: Self-pay | Admitting: Emergency Medicine

## 2019-12-06 DIAGNOSIS — Z87891 Personal history of nicotine dependence: Secondary | ICD-10-CM | POA: Insufficient documentation

## 2019-12-06 DIAGNOSIS — J069 Acute upper respiratory infection, unspecified: Secondary | ICD-10-CM | POA: Diagnosis not present

## 2019-12-06 DIAGNOSIS — Z20822 Contact with and (suspected) exposure to covid-19: Secondary | ICD-10-CM | POA: Diagnosis not present

## 2019-12-06 DIAGNOSIS — J029 Acute pharyngitis, unspecified: Secondary | ICD-10-CM | POA: Diagnosis present

## 2019-12-06 LAB — GROUP A STREP BY PCR: Group A Strep by PCR: NOT DETECTED

## 2019-12-06 LAB — RESPIRATORY PANEL BY RT PCR (FLU A&B, COVID)
Influenza A by PCR: NEGATIVE
Influenza B by PCR: NEGATIVE
SARS Coronavirus 2 by RT PCR: NEGATIVE

## 2019-12-06 MED ORDER — ACETAMINOPHEN 325 MG PO TABS
650.0000 mg | ORAL_TABLET | Freq: Once | ORAL | Status: AC
  Administered 2019-12-06: 650 mg via ORAL
  Filled 2019-12-06: qty 2

## 2019-12-06 MED ORDER — ONDANSETRON 4 MG PO TBDP
4.0000 mg | ORAL_TABLET | Freq: Once | ORAL | Status: AC
  Administered 2019-12-06: 4 mg via ORAL
  Filled 2019-12-06: qty 1

## 2019-12-06 MED ORDER — ONDANSETRON 4 MG PO TBDP: 4.0000 mg | ORAL_TABLET | Freq: Three times a day (TID) | ORAL | 0 refills | Status: DC | PRN

## 2019-12-06 NOTE — ED Triage Notes (Signed)
Patient arrives to ED with complaints of worsening sore throat x4 days and left ear pain x3days. Pt denies fever, chills, and cough.

## 2019-12-06 NOTE — Discharge Instructions (Addendum)
Your Covid test was negative.  Your strep test is negative.  As discussed and you most likely have a viral syndrome, most likely from common cold.  I want you to drink plenty of fluids and get rested.  Take the next couple of days off if you can, I did write a work note.  If you have any new or worsening concerning symptoms please come back to the emergency department.  Please use the attached instructions.  You can use the Zofran as prescribed on the bottle for nausea as needed.

## 2019-12-06 NOTE — ED Notes (Signed)
Pt passed PO challenge, ED PA notified

## 2019-12-06 NOTE — ED Provider Notes (Signed)
[facility] EMERGENCY DEPARTMENT Provider Note   CSN: [id number] Arrival date & time: 12/06/19  7829     History No chief complaint on file.   Brandi Lucero is a 34 y.o. female with pertinent past medical history of anxiety, depression, bipolar 1 that presents the emergency department today for sore throat, congestion, ear pain, myalgias for the past 3 days.  Patient has not been vaccinated against Covid.  Denies any cough.  Denies any fevers, chills.  States that she did feel slightly nauseous and had episode of nonbillious nonhbloody vomit while here in the emergency department.  Denies any sick contacts.  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  Also admits to mild headache, frontal.  Myalgias in lower extremities, neck and head.  Denies any chest pain or shortness of breath.  No vision changes, injury to head, photophobia, numbness, tingling.  Denies any lung history.  Has tried some Tylenol to relieve this morning.  Denies any new diarrhea.  Is able to swallow, is drinking and eating normally.  Is urinating normally.  HPI     Past Medical History:  Diagnosis Date  . Anxiety   . Bipolar 1 disorder ([place])   . Depression   . Ectopic pregnancy   . IBS (irritable bowel syndrome) 2009    Patient Active Problem List   Diagnosis Date Noted  . Allergic rhinitis 03/01/2014  . Bipolar 2 disorder ([place]) 02/10/2014  . Anxiety 02/10/2014    Past Surgical History:  Procedure Laterality Date  . DILATION AND EVACUATION  03/03/2012  . ECTOPIC PREGNANCY SURGERY  03/03/2004  . LAPAROSCOPY FOR ECTOPIC PREGNANCY  03/03/2004  . WISDOM TOOTH EXTRACTION       OB History    Gravida  3   Para      Term      Preterm      AB  2   Living        SAB  1   TAB      Ectopic  1   Multiple      Live Births              Family History  Problem Relation Age of Onset  . Colon cancer Father   . High blood pressure  Mother     Social History   Tobacco Use  . Smoking status: Former Smoker    Packs/day: 0.50    Types: Cigarettes  . Smokeless tobacco: Never Used  Substance Use Topics  . Alcohol use: Yes    Alcohol/week: 2.0 standard drinks    Types: 2 Standard drinks or equivalent per week  . Drug use: No    Home Medications Prior to Admission medications   Medication Sig Start Date End Date Taking? Authorizing Provider  cephALEXin (KEFLEX) 500 MG capsule Take 1 capsule (500 mg total) by mouth 2 (two) times daily. 07/22/18   Montine Circle, PA-C  cetirizine (ZYRTEC ALLERGY) 10 MG tablet Take 1 tablet (10 mg total) by mouth at bedtime. Patient not taking: Reported on 09/19/2015 04/25/15   Delsa Grana, PA-C  diazepam (VALIUM) 5 MG tablet Take 1 tablet (5 mg total) by mouth every 8 (eight) hours as needed for anxiety or muscle spasms (or pain). 09/19/15   Clayton Bibles, PA-C  fexofenadine (ALLEGRA) 60 MG tablet Take 3 tablets (180 mg total) by mouth daily. Patient not taking: Reported on 09/19/2015 02/07/14   Street, [place], PA-C  fluticasone (  FLONASE) 50 MCG/ACT nasal spray Place 2 sprays into both nostrils daily. Patient not taking: Reported on 09/19/2015 04/25/15   Delsa Grana, PA-C  LORazepam (ATIVAN) 0.5 MG tablet Take 1/2 tablet by mouth twice daily for anxiety Patient not taking: Reported on 09/19/2015 09/22/14   Hollace Kinnier L, DO  metroNIDAZOLE (FLAGYL) 500 MG tablet Take 1 tablet (500 mg total) by mouth 2 (two) times daily. 07/22/18   Montine Circle, PA-C  naproxen (NAPROSYN) 500 MG tablet Take 1 tablet (500 mg total) by mouth 2 (two) times daily. 02/28/17   Khatri, Hina, PA-C  ondansetron (ZOFRAN ODT) 4 MG disintegrating tablet Take 1 tablet (4 mg total) by mouth every 8 (eight) hours as needed for nausea or vomiting. 12/06/19   Alfredia Client, PA-C  sodium chloride (OCEAN) 0.65 % SOLN nasal spray Place 1 spray into both nostrils as needed for congestion. Patient not taking: Reported on  09/19/2015 01/30/14   Antonietta Breach, PA-C  Sodium Chloride-Sodium Bicarb (NETI POT SINUS [place]) 2300-700 MG KIT Place 1 application into the nose 3 (three) times daily as needed (sinus congestion). Patient not taking: Reported on 09/19/2015 02/07/14   Street, [place], PA-C  zolpidem (AMBIEN) 5 MG tablet Take 1 tablet (5 mg total) by mouth at bedtime as needed for sleep. Patient not taking: Reported on 09/19/2015 09/05/14   Gildardo Cranker, DO    Allergies    Vicodin [hydrocodone-acetaminophen]  Review of Systems   Review of Systems  Constitutional: Negative for diaphoresis, fatigue and fever.  HENT: Positive for congestion, ear pain, sneezing and sore throat. Negative for dental problem, drooling, ear discharge, sinus pressure, sinus pain, tinnitus, trouble swallowing and voice change.   Eyes: Negative for pain and visual disturbance.  Respiratory: Negative for cough, chest tightness and shortness of breath.   Cardiovascular: Negative for chest pain and palpitations.  Gastrointestinal: Positive for nausea. Negative for abdominal pain, constipation, diarrhea and vomiting.  Musculoskeletal: Negative for back pain, myalgias and neck pain.  Skin: Negative for color change, pallor, rash and wound.  Neurological: Positive for headaches. Negative for syncope, weakness, light-headedness and numbness.  Psychiatric/Behavioral: Negative for behavioral problems and confusion.    Physical Exam Updated Vital Signs BP 110/72 (BP Location: Left Arm)   Pulse 68   Temp 98.6 F (37 C) (Oral)   Resp 18   Ht 5' (1.524 m)   Wt 57.6 kg   SpO2 100%   BMI 24.80 kg/m   Physical Exam Constitutional:      General: She is not in acute distress.    Appearance: Normal appearance. She is not ill-appearing, toxic-appearing or diaphoretic.     Comments: Patient without acute respiratory stress.  Patient is sitting comfortably in bed, no tripoding, use of accessory muscles.  Patient is speaking to me in full  sentences.  Handling secretions well.  HENT:     Head: Normocephalic and atraumatic.     Jaw: There is normal jaw occlusion. No trismus, swelling or malocclusion.     Right Ear: Tympanic membrane, ear canal and external ear normal.     Left Ear: Tympanic membrane, ear canal and external ear normal.     Nose: No congestion or rhinorrhea.     Right Sinus: No maxillary sinus tenderness or frontal sinus tenderness.     Left Sinus: No maxillary sinus tenderness or frontal sinus tenderness.     Mouth/Throat:     Mouth: Mucous membranes are moist. No oral lesions.     Dentition: Normal dentition.  Tongue: No lesions.     Palate: No mass and lesions.     Pharynx: Oropharynx is clear. Uvula midline. Posterior oropharyngeal erythema present. No pharyngeal swelling, oropharyngeal exudate or uvula swelling.     Tonsils: No tonsillar exudate or tonsillar abscesses. 1+ on the right. 1+ on the left.     Comments: Patient without tonsillar enlargement or exudate.  No signs of peritonsillar abscess, palate without any tenderness or masses palpated.  No swelling under the tongue, uvula is midline without any inflammation. Eyes:     General: No visual field deficit.       Right eye: No discharge.        Left eye: No discharge.     Extraocular Movements: Extraocular movements intact.     Conjunctiva/sclera: Conjunctivae normal.     Pupils: Pupils are equal, round, and reactive to light.  Cardiovascular:     Rate and Rhythm: Normal rate and regular rhythm.     Pulses: Normal pulses.     Heart sounds: Normal heart sounds. No murmur heard.  No friction rub. No gallop.   Pulmonary:     Effort: Pulmonary effort is normal. No respiratory distress.     Breath sounds: Normal breath sounds. No stridor. No wheezing, rhonchi or rales.  Chest:     Chest wall: No tenderness.  Abdominal:     General: Abdomen is flat. Bowel sounds are normal. There is no distension.     Palpations: Abdomen is soft.      Tenderness: There is no abdominal tenderness. There is no right CVA tenderness or left CVA tenderness.  Musculoskeletal:        General: No swelling or tenderness. Normal range of motion.     Cervical back: Full passive range of motion without pain, normal range of motion and neck supple. No rigidity or tenderness. No pain with movement, spinous process tenderness or muscular tenderness. Normal range of motion.     Right lower leg: No edema.     Left lower leg: No edema.  Lymphadenopathy:     Cervical: No cervical adenopathy.  Skin:    General: Skin is warm and dry.     Capillary Refill: Capillary refill takes less than 2 seconds.     Findings: No erythema or rash.  Neurological:     General: No focal deficit present.     Mental Status: She is alert and oriented to person, place, and time.     Cranial Nerves: Cranial nerves are intact. No cranial nerve deficit or facial asymmetry.     Motor: Motor function is intact. No weakness.     Coordination: Coordination is intact.     Gait: Gait is intact. Gait normal.  Psychiatric:        Mood and Affect: Mood normal.     ED Results / Procedures / Treatments   Labs (all labs ordered are listed, but only abnormal results are displayed) Labs Reviewed  GROUP A STREP BY PCR  RESPIRATORY PANEL BY RT PCR (FLU A&B, COVID)    EKG None  Radiology No results found.  Procedures Procedures (including critical care time)  Medications Ordered in ED Medications  ondansetron (ZOFRAN-ODT) disintegrating tablet 4 mg (4 mg Oral Given 12/06/19 1334)  acetaminophen (TYLENOL) tablet 650 mg (650 mg Oral Given 12/06/19 1401)    ED Course  I have reviewed the triage vital signs and the nursing notes.  Pertinent labs & imaging results that were available during my care of the  patient were reviewed by me and considered in my medical decision making (see chart for details).    MDM Rules/Calculators/A&P                         NASHA DISS is a 34  y.o. female with pertinent past medical history of anxiety, depression, bipolar 1 that presents the emergency department today for sore throat, congestion, ear pain, myalgias for the past 3 days.  Patient appears well, is handling secretions and is able to speak in full sentences.  No drooling, trismus, is able to move neck in all directions.  Stable vitals, afebrile.  No concern for deep space infection or Ludwig's or or peritonsillar abscess.  Initial interventions include Zofran and Tylenol.  Patient passed p.o. challenge.  Strep test negative.  Covid test negative.  Patient likely has upper respiratory infection, most likely viral in nature.  Did discuss symptomatic treatment in depth with patient including Tylenol, Benadryl, salt water gargling if symptoms continue or worsen to come back here or to urgent care for reevaluation.  Did discuss that this most likely viral, no need for antibiotics this time.  Patient agrees.  Patient to be discharged.  Doubt need for further emergent work up at this time. I explained the diagnosis and have given explicit precautions to return to the ER including for any other new or worsening symptoms. The patient understands and accepts the medical plan as it's been dictated and I have answered their questions. Discharge instructions concerning home care and prescriptions have been given. The patient is STABLE and is discharged to home in good condition.   Final Clinical Impression(s) / ED Diagnoses Final diagnoses:  Viral upper respiratory tract infection    Rx / DC Orders ED Discharge Orders         Ordered    ondansetron (ZOFRAN ODT) 4 MG disintegrating tablet  Every 8 hours PRN        12/06/19 1458           Alfredia Client, PA-C 12/06/19 1505    Maudie Flakes, MD 12/08/19 2255

## 2021-02-06 ENCOUNTER — Emergency Department (HOSPITAL_COMMUNITY): Payer: BC Managed Care – PPO

## 2021-02-06 ENCOUNTER — Emergency Department (HOSPITAL_COMMUNITY)
Admission: EM | Admit: 2021-02-06 | Discharge: 2021-02-06 | Disposition: A | Discharge status: Home / Self Care | Payer: BC Managed Care – PPO | Attending: Emergency Medicine | Admitting: Emergency Medicine

## 2021-02-06 ENCOUNTER — Other Ambulatory Visit: Payer: Self-pay

## 2021-02-06 DIAGNOSIS — R42 Dizziness and giddiness: Secondary | ICD-10-CM | POA: Insufficient documentation

## 2021-02-06 DIAGNOSIS — R1032 Left lower quadrant pain: Secondary | ICD-10-CM | POA: Insufficient documentation

## 2021-02-06 DIAGNOSIS — R0781 Pleurodynia: Secondary | ICD-10-CM | POA: Diagnosis not present

## 2021-02-06 DIAGNOSIS — M25532 Pain in left wrist: Secondary | ICD-10-CM | POA: Insufficient documentation

## 2021-02-06 DIAGNOSIS — R112 Nausea with vomiting, unspecified: Secondary | ICD-10-CM | POA: Diagnosis not present

## 2021-02-06 DIAGNOSIS — K769 Liver disease, unspecified: Secondary | ICD-10-CM | POA: Diagnosis not present

## 2021-02-06 DIAGNOSIS — R519 Headache, unspecified: Secondary | ICD-10-CM | POA: Insufficient documentation

## 2021-02-06 DIAGNOSIS — Z87891 Personal history of nicotine dependence: Secondary | ICD-10-CM | POA: Insufficient documentation

## 2021-02-06 DIAGNOSIS — Y92410 Unspecified street and highway as the place of occurrence of the external cause: Secondary | ICD-10-CM | POA: Diagnosis not present

## 2021-02-06 LAB — COMPREHENSIVE METABOLIC PANEL
ALT: 19 U/L (ref 0–44)
AST: 22 U/L (ref 15–41)
Albumin: 3.7 g/dL (ref 3.5–5.0)
Alkaline Phosphatase: 53 U/L (ref 38–126)
Anion gap: 8 (ref 5–15)
BUN: 9 mg/dL (ref 6–20)
CO2: 22 mmol/L (ref 22–32)
Calcium: 8.6 mg/dL — ABNORMAL LOW (ref 8.9–10.3)
Chloride: 106 mmol/L (ref 98–111)
Creatinine, Ser: 0.72 mg/dL (ref 0.44–1.00)
GFR, Estimated: >60 mL/min (ref >60)
Glucose, Bld: 91 mg/dL (ref 70–99)
Potassium: 3.4 mmol/L — ABNORMAL LOW (ref 3.5–5.1)
Sodium: 136 mmol/L (ref 135–145)
Total Bilirubin: 0.5 mg/dL (ref 0.3–1.2)
Total Protein: 6.9 g/dL (ref 6.5–8.1)

## 2021-02-06 LAB — URINALYSIS, ROUTINE W REFLEX MICROSCOPIC
Bilirubin Urine: NEGATIVE
Glucose, UA: NEGATIVE mg/dL
Ketones, ur: NEGATIVE mg/dL
Leukocytes,Ua: NEGATIVE
Nitrite: NEGATIVE
Protein, ur: NEGATIVE mg/dL
Specific Gravity, Urine: 1.02 (ref 1.005–1.030)
pH: 7 (ref 5.0–8.0)

## 2021-02-06 LAB — CBC WITH DIFFERENTIAL/PLATELET
Abs Immature Granulocytes: 0.01 10*3/uL (ref 0.00–0.07)
Basophils Absolute: 0 10*3/uL (ref 0.0–0.1)
Basophils Relative: 1 %
Eosinophils Absolute: 0.2 10*3/uL (ref 0.0–0.5)
Eosinophils Relative: 5 %
HCT: 37.8 % (ref 36.0–46.0)
Hemoglobin: 12.4 g/dL (ref 12.0–15.0)
Immature Granulocytes: 0 %
Lymphocytes Relative: 40 %
Lymphs Abs: 1.7 10*3/uL (ref 0.7–4.0)
MCH: 29.6 pg (ref 26.0–34.0)
MCHC: 32.8 g/dL (ref 30.0–36.0)
MCV: 90.2 fL (ref 80.0–100.0)
Monocytes Absolute: 0.3 10*3/uL (ref 0.1–1.0)
Monocytes Relative: 8 %
Neutro Abs: 1.9 10*3/uL (ref 1.7–7.7)
Neutrophils Relative %: 46 %
Platelets: 273 10*3/uL (ref 150–400)
RBC: 4.19 MIL/uL (ref 3.87–5.11)
RDW: 13 % (ref 11.5–15.5)
WBC: 4.2 10*3/uL (ref 4.0–10.5)
nRBC: 0 % (ref 0.0–0.2)

## 2021-02-06 LAB — URINALYSIS, MICROSCOPIC (REFLEX)

## 2021-02-06 LAB — PREGNANCY, URINE: Preg Test, Ur: NEGATIVE

## 2021-02-06 MED ORDER — LACTATED RINGERS IV BOLUS
1000.0000 mL | Freq: Once | INTRAVENOUS | Status: AC
  Administered 2021-02-06: 1000 mL via INTRAVENOUS

## 2021-02-06 MED ORDER — KETOROLAC TROMETHAMINE 15 MG/ML IJ SOLN
15.0000 mg | Freq: Once | INTRAMUSCULAR | Status: AC
  Administered 2021-02-06: 15 mg via INTRAVENOUS
  Filled 2021-02-06: qty 1

## 2021-02-06 MED ORDER — IOHEXOL 350 MG/ML SOLN
100.0000 mL | Freq: Once | INTRAVENOUS | Status: AC | PRN
  Administered 2021-02-06: 100 mL via INTRAVENOUS

## 2021-02-06 NOTE — ED Notes (Signed)
Pt c/o increasing L wrist pain and chest soreness.  C/o congested cough and hoarseness  several days.
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
## 2021-02-06 NOTE — ED Notes (Signed)
Ortho Tech at bedside.  
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
## 2021-02-06 NOTE — Progress Notes (Signed)
Orthopedic Tech Progress Note Patient Details:  Brandi Lucero Nov 20, 1985 [id number]  Ortho Devices Type of Ortho Device: Velcro wrist forearm splint Ortho Device/Splint Location: LUE Ortho Device/Splint Interventions: Ordered, Application   Post Interventions Patient Tolerated: Well Instructions Provided: Care of device  Donald Pore 02/06/2021, 3:46 PM
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
## 2021-02-06 NOTE — Discharge Instructions (Addendum)
Your trauma imaging did not reveal any acute abnormalities.  You did have some incidental findings on your CT chest abdomen pelvis which will warrant further follow-up outpatient. It was recommended you get an MRI to further evaluate.  1. No acute injury involving the chest, abdomen or pelvis is  identified.  2. Multiple enhancing liver lesions. These could be atypical hepatic  hemangiomas but are more likely hepatic adenomas or FNH. There are  also several small low-attenuation lesions which are not simple  cysts and could be hemangiomas. MRI abdomen without and with  contrast (using Eovist) is recommended for further evaluation.  3. Markedly enlarged fibroid uterus.  4. Contracted gallbladder with probable gallstones.

## 2021-02-13 ENCOUNTER — Other Ambulatory Visit: Payer: Self-pay | Admitting: Family Medicine

## 2021-02-13 DIAGNOSIS — K769 Liver disease, unspecified: Secondary | ICD-10-CM

## 2021-03-09 ENCOUNTER — Other Ambulatory Visit: Payer: Self-pay

## 2021-03-09 ENCOUNTER — Ambulatory Visit
Admission: RE | Admit: 2021-03-09 | Discharge: 2021-03-09 | Disposition: A | Discharge status: Home / Self Care | Payer: BC Managed Care – PPO | Source: Ambulatory Visit | Attending: Family Medicine | Admitting: Family Medicine

## 2021-03-09 DIAGNOSIS — K769 Liver disease, unspecified: Secondary | ICD-10-CM

## 2021-03-09 MED ORDER — GADOBENATE DIMEGLUMINE 529 MG/ML IV SOLN
12.0000 mL | Freq: Once | INTRAVENOUS | Status: AC | PRN
  Administered 2021-03-09: 12 mL via INTRAVENOUS

## 2022-05-15 ENCOUNTER — Ambulatory Visit (HOSPITAL_COMMUNITY)
Admission: EM | Admit: 2022-05-15 | Discharge: 2022-05-15 | Disposition: A | Discharge status: Home / Self Care | Payer: BC Managed Care – PPO | Attending: Family Medicine | Admitting: Family Medicine

## 2022-05-15 ENCOUNTER — Encounter (HOSPITAL_COMMUNITY): Payer: Self-pay | Admitting: Emergency Medicine

## 2022-05-15 ENCOUNTER — Other Ambulatory Visit: Payer: Self-pay

## 2022-05-15 DIAGNOSIS — T50905A Adverse effect of unspecified drugs, medicaments and biological substances, initial encounter: Secondary | ICD-10-CM | POA: Diagnosis not present

## 2022-05-15 DIAGNOSIS — H66002 Acute suppurative otitis media without spontaneous rupture of ear drum, left ear: Secondary | ICD-10-CM | POA: Diagnosis not present

## 2022-05-15 MED ORDER — METHYLPREDNISOLONE 4 MG PO TBPK: ORAL_TABLET | ORAL | 0 refills | Status: DC

## 2022-05-15 MED ORDER — CEFDINIR 300 MG PO CAPS: 300.0000 mg | ORAL_CAPSULE | Freq: Two times a day (BID) | ORAL | 0 refills | Status: AC

## 2022-05-15 NOTE — ED Triage Notes (Signed)
Pt was seen here on Monday and was positive for strep. She was started on penicillin and now she has a rash on her arms. Left ear pain and jaw pain and feeling dizziness.

## 2022-05-15 NOTE — Discharge Instructions (Signed)
You were diagnosed with an ear infection today.  I have sent out a different antibiotic to take twice/day x 10 days.  I have also sent out a steroid pack to help with infection and pain.  I recommend you use over the counter claritin or zyrtec for congestion and drainage.  You may use motrin for pain as well.  The steroid will help with the rash.  If the rash worsens with the new antibiotic the please return for re-evaluation.

## 2022-05-15 NOTE — ED Provider Notes (Signed)
[facility]    CSN: [id number] Arrival date & time: 05/15/22  1135      History   Chief Complaint Chief Complaint  Patient presents with   Otalgia    HPI Brandi Lucero is a 37 y.o. female.   She was at the [facility] on Monday, dx with strep throat.  Given PCN.  Yesterday she woke up with left ear pain, and left jaw pain.  She then noted a rash on her arms, stomach and back.  She also feels dizzy.  No fevers.  Sore throat is feeling better overall.        Past Medical History:  Diagnosis Date   Anxiety    Bipolar 1 disorder ([place])    Depression    Ectopic pregnancy    IBS (irritable bowel syndrome) 2009    Patient Active Problem List   Diagnosis Date Noted   Allergic rhinitis 03/01/2014   Bipolar 2 disorder ([place]) 02/10/2014   Anxiety 02/10/2014    Past Surgical History:  Procedure Laterality Date   DILATION AND EVACUATION  03/03/2012   ECTOPIC PREGNANCY SURGERY  03/03/2004   LAPAROSCOPY FOR ECTOPIC PREGNANCY  03/03/2004   WISDOM TOOTH EXTRACTION      OB History     Gravida  3   Para      Term      Preterm      AB  2   Living         SAB  1   IAB      Ectopic  1   Multiple      Live Births               Home Medications    Prior to Admission medications   Medication Sig Start Date End Date Taking? Authorizing Provider  albuterol (VENTOLIN HFA) 108 (90 Base) MCG/ACT inhaler Inhale 2 puffs into the lungs every 6 (six) hours as needed for wheezing or shortness of breath.    [provider]  clonazePAM (KLONOPIN) 0.5 MG tablet Take 0.5 mg by mouth 2 (two) times daily as needed for anxiety.    [provider]  escitalopram (LEXAPRO) 10 MG tablet Take 10 mg by mouth daily.    [provider]  Pseudoeph-Doxylamine-DM-APAP (NYQUIL PO) Take 2 capsules by mouth at bedtime as needed (for cough).    [provider]  rOPINIRole (REQUIP) 0.25 MG tablet Take 0.25-0.5 mg by mouth at bedtime.    [provider]    Family History Family History  Problem Relation Age of Onset   Colon cancer Father    High blood pressure Mother     Social History Social History   Tobacco Use   Smoking status: Former    Packs/day: .5    Types: Cigarettes   Smokeless tobacco: Never  Substance Use Topics   Alcohol use: Yes    Alcohol/week: 2.0 standard drinks of alcohol    Types: 2 Standard drinks or equivalent per week   Drug use: No     Allergies   Vicodin [hydrocodone-acetaminophen]   Review of Systems Review of Systems  Constitutional: Negative.   HENT:  Positive for ear pain, rhinorrhea and sore throat. Negative for ear discharge, sinus pressure and sinus pain.   Respiratory: Negative.    Cardiovascular: Negative.   Gastrointestinal: Negative.   Musculoskeletal: Negative.   Neurological:  Positive for dizziness.     Physical Exam Triage Vital Signs ED Triage Vitals  Enc  Vitals Group     BP 05/15/22 1153 132/86     Pulse Rate 05/15/22 1153 88     Resp 05/15/22 1153 18     Temp 05/15/22 1153 98.2 F (36.8 C)     Temp Source 05/15/22 1153 Oral     SpO2 05/15/22 1153 99 %     Weight 05/15/22 1154 126 lb 15.8 oz (57.6 kg)     Height 05/15/22 1154 '5\' 1"'$  (1.549 m)     Head Circumference --      Peak Flow --      Pain Score 05/15/22 1153 10     Pain Loc --      Pain Edu? --      Excl. in [place]? --    No data found.  Updated Vital Signs BP 132/86 (BP Location: Right Arm)   Pulse 88   Temp 98.2 F (36.8 C) (Oral)   Resp 18   Ht '5\' 1"'$  (1.549 m)   Wt 57.6 kg   SpO2 99%   BMI 23.99 kg/m   Visual Acuity Right Eye Distance:   Left Eye Distance:   Bilateral Distance:    Right Eye Near:   Left Eye Near:    Bilateral Near:     Physical Exam Constitutional:      Appearance: Normal appearance.  HENT:     Right Ear: Tympanic membrane normal.     Left Ear: Tympanic membrane is erythematous and bulging.     Mouth/Throat:     Mouth: Mucous membranes are moist.      Pharynx: Posterior oropharyngeal erythema present.  Cardiovascular:     Rate and Rhythm: Normal rate and regular rhythm.  Pulmonary:     Effort: Pulmonary effort is normal.     Breath sounds: Normal breath sounds.  Musculoskeletal:     Cervical back: Normal range of motion and neck supple. Tenderness present.  Lymphadenopathy:     Cervical: Cervical adenopathy present.  Skin:    Comments: Generalized mild red rash on the arms bilaterally as well as the abdomen;   Neurological:     General: No focal deficit present.     Mental Status: She is alert.  Psychiatric:        Mood and Affect: Mood normal.      UC Treatments / Results  Labs (all labs ordered are listed, but only abnormal results are displayed) Labs Reviewed - No data to display  EKG   Radiology No results found.  Procedures Procedures (including critical care time)  Medications Ordered in UC Medications - No data to display  Initial Impression / Assessment and Plan / UC Course  I have reviewed the triage vital signs and the nursing notes.  Pertinent labs & imaging results that were available during my care of the patient were reviewed by me and considered in my medical decision making (see chart for details).   Final Clinical Impressions(s) / UC Diagnoses   Final diagnoses:  Non-recurrent acute suppurative otitis media of left ear without spontaneous rupture of tympanic membrane  Adverse effect of drug, initial encounter     Discharge Instructions      You were diagnosed with an ear infection today.  I have sent out a different antibiotic to take twice/day x 10 days.  I have also sent out a steroid pack to help with infection and pain.  I recommend you use over the counter claritin or zyrtec for congestion and drainage.  You may use motrin  for pain as well.  The steroid will help with the rash.  If the rash worsens with the new antibiotic the please return for re-evaluation.      ED  Prescriptions     Medication Sig Dispense Auth. Provider   cefdinir (OMNICEF) 300 MG capsule Take 1 capsule (300 mg total) by mouth 2 (two) times daily for 10 days. 20 capsule Braydn Carneiro, Junie Panning, MD   methylPREDNISolone (MEDROL DOSEPAK) 4 MG TBPK tablet Take as directed 1 each Rondel Oh, MD      PDMP not reviewed this encounter.   Rondel Oh, MD 05/15/22 1226

## 2022-06-20 ENCOUNTER — Ambulatory Visit (HOSPITAL_COMMUNITY)
Admission: EM | Admit: 2022-06-20 | Discharge: 2022-06-20 | Disposition: A | Discharge status: Home / Self Care | Payer: BC Managed Care – PPO

## 2022-06-20 ENCOUNTER — Encounter (HOSPITAL_COMMUNITY): Payer: Self-pay

## 2022-06-20 DIAGNOSIS — R609 Edema, unspecified: Secondary | ICD-10-CM | POA: Diagnosis not present

## 2022-06-20 LAB — POCT URINALYSIS DIP (MANUAL ENTRY)
Bilirubin, UA: NEGATIVE
Glucose, UA: NEGATIVE mg/dL
Leukocytes, UA: NEGATIVE
Nitrite, UA: NEGATIVE
Protein Ur, POC: NEGATIVE mg/dL
Spec Grav, UA: >=1.03 — AB (ref 1.010–1.025)
Urobilinogen, UA: 0.2 E.U./dL
pH, UA: 5.5 (ref 5.0–8.0)

## 2022-06-20 MED ORDER — FUROSEMIDE 20 MG PO TABS: 20.0000 mg | ORAL_TABLET | Freq: Every day | ORAL | 0 refills | Status: DC

## 2022-06-20 NOTE — ED Triage Notes (Signed)
Pt states that 2 weeks ago she noticed her ankles were swollen. Feet are tingling.Legs started getting tight and she noticed this week that arms were  tight. Hasn't taken anything for pain. No trauma. Drives a bus.    Pt states that she can't hear out of her left ear. She had an ear infection a few weeks ago and still can't hear. She can hear her heart beat at night.

## 2022-06-20 NOTE — ED Provider Notes (Signed)
[facility]    CSN: [id number] Arrival date & time: 06/20/22  1836      History   Chief Complaint Chief Complaint  Patient presents with   Leg Pain   Arm Pain    HPI Brandi Lucero is a 37 y.o. female.   Patient presents to clinic for bilateral lower extremity edema.  She noticed 2 weeks ago that her ankles were swollen, she reports it has gotten worse and she feels like it has progressed up to her calves bilaterally.  She denies any chest pain, shortness of breath.  She does not smoke.  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  She reports her arms also kind of feel tight but she also has anxiety.  Her menstrual cycle started today.  She is 151 pounds at her previous visit, is 158 pounds today.  She reports she is also urinating a lot at night.  No history of any DVT.  The history is provided by the patient. The history is limited by a language barrier.  Leg Pain Associated symptoms: no fatigue and no fever   Arm Pain Pertinent negatives include no chest pain, no abdominal pain and no shortness of breath.    Past Medical History:  Diagnosis Date   Anxiety    Bipolar 1 disorder    Depression    Ectopic pregnancy    IBS (irritable bowel syndrome) 2009    Patient Active Problem List   Diagnosis Date Noted   Allergic rhinitis 03/01/2014   Bipolar 2 disorder 02/10/2014   Anxiety 02/10/2014    Past Surgical History:  Procedure Laterality Date   DILATION AND EVACUATION  03/03/2012   ECTOPIC PREGNANCY SURGERY  03/03/2004   LAPAROSCOPY FOR ECTOPIC PREGNANCY  03/03/2004   WISDOM TOOTH EXTRACTION      OB History     Gravida  3   Para      Term      Preterm      AB  2   Living         SAB  1   IAB      Ectopic  1   Multiple      Live Births               Home Medications    Prior to Admission medications   Medication Sig Start Date End Date Taking?  Authorizing Provider  ALPRAZolam Prudy Feeler) 0.25 MG tablet 1 tablet Orally Twice a day as needed for 30 days 04/18/22  Yes [provider]  escitalopram (LEXAPRO) 10 MG tablet Take 10 mg by mouth daily.   Yes [provider]  furosemide (LASIX) 20 MG tablet Take 1 tablet (20 mg total) by mouth daily for 5 days. 06/20/22 06/25/22 Yes Rinaldo Ratel, [place], FNP  rOPINIRole (REQUIP) 0.25 MG tablet Take 0.25-0.5 mg by mouth at bedtime.   Yes [provider]    Family History Family History  Problem Relation Age of Onset   Colon cancer Father    High blood pressure Mother     Social History Social History   Tobacco Use   Smoking status: Former    Packs/day: .5    Types: Cigarettes   Smokeless tobacco: Never  Substance Use Topics   Alcohol use: Yes    Alcohol/week: 2.0 standard drinks of alcohol    Types: 2 Standard drinks or equivalent per week  Drug use: No     Allergies   Latex, Vicodin [hydrocodone-acetaminophen], and Penicillins   Review of Systems Review of Systems  Constitutional:  Negative for chills, fatigue and fever.  HENT:  Positive for ear pain. Negative for ear discharge and sore throat.   Respiratory:  Negative for cough, shortness of breath and wheezing.   Cardiovascular:  Positive for leg swelling. Negative for chest pain.  Gastrointestinal:  Negative for abdominal pain.  Genitourinary:  Positive for frequency. Negative for dysuria.     Physical Exam Triage Vital Signs ED Triage Vitals  Enc Vitals Group     BP 06/20/22 1916 127/85     Pulse Rate 06/20/22 1916 78     Resp 06/20/22 1916 18     Temp 06/20/22 1916 98.4 F (36.9 C)     Temp Source 06/20/22 1916 Oral     SpO2 06/20/22 1916 98 %     Weight 06/20/22 1911 158 lb (71.7 kg)     Height --      Head Circumference --      Peak Flow --      Pain Score 06/20/22 1911 6     Pain Loc --      Pain Edu? --      Excl. in GC? --    No data found.  Updated Vital Signs BP  127/85 (BP Location: Left Arm)   Pulse 78   Temp 98.4 F (36.9 C) (Oral)   Resp 18   Wt 158 lb (71.7 kg)   LMP 06/20/2022   SpO2 98%   BMI 29.85 kg/m   Visual Acuity Right Eye Distance:   Left Eye Distance:   Bilateral Distance:    Right Eye Near:   Left Eye Near:    Bilateral Near:     Physical Exam Vitals and nursing note reviewed.  Constitutional:      General: She is not in acute distress.    Appearance: She is well-developed.  HENT:     Head: Normocephalic and atraumatic.     Right Ear: Ear canal and external ear normal.     Left Ear: Ear canal and external ear normal.     Nose: Nose normal.     Mouth/Throat:     Mouth: Mucous membranes are moist.  Eyes:     Conjunctiva/sclera: Conjunctivae normal.  Cardiovascular:     Rate and Rhythm: Normal rate and regular rhythm.     Heart sounds: Normal heart sounds, S1 normal and S2 normal. No murmur heard. Pulmonary:     Effort: Pulmonary effort is normal. No respiratory distress.     Breath sounds: Normal breath sounds.  Musculoskeletal:        General: Swelling and tenderness present. Normal range of motion.     Right lower leg: 2+ Edema present.     Left lower leg: 2+ Edema present.  Skin:    General: Skin is warm and dry.     Capillary Refill: Capillary refill takes less than 2 seconds.  Neurological:     Mental Status: She is alert and oriented to person, place, and time.  Psychiatric:        Mood and Affect: Mood normal.        Behavior: Behavior is cooperative.      UC Treatments / Results  Labs (all labs ordered are listed, but only abnormal results are displayed) Labs Reviewed  POCT URINALYSIS DIP (MANUAL ENTRY) - Abnormal; Notable for the following components:  Result Value   Ketones, POC UA trace (5) (*)    Spec Grav, UA >=1.030 (*)    Blood, UA trace-intact (*)    All other components within normal limits    EKG   Radiology No results found.  Procedures Procedures (including  critical care time)  Medications Ordered in UC Medications - No data to display  Initial Impression / Assessment and Plan / UC Course  I have reviewed the triage vital signs and the nursing notes.  Pertinent labs & imaging results that were available during my care of the patient were reviewed by me and considered in my medical decision making (see chart for details).  Vitals and triage reviewed, patient is hemodynamically stable.  Bilateral lower extremity 2+ nonpitting edema.  Recently finished steroids and then edema started.  Urinalysis without protein, does have trace red blood cells, is on her menses, specific gravity is over 1.030 and with trace ketones.  Wells criteria is 0. will trial 20 mg of Lasix over the next 5 days and encouraged to follow-up with primary care provider for further evaluation.  Encouraged on symptomatic management of lower extremity elevation and compression.  Strict emergency return precautions given, patient verbalized understanding.    Final Clinical Impressions(s) / UC Diagnoses   Final diagnoses:  Dependent edema     Discharge Instructions      Your urine was without signs of kidney damage.  I believe that your lower extremity leg swelling is due to be immobile for prolonged periods of time and your recent steroid Dosepak.  Please take the Lasix every morning for the next 5 days.  Over this time, please schedule follow-up with your primary care provider to be reevaluated early next week to ensure improvement.  I have low suspicion for a blood clot, however, if anything changes like you develop shortness of breath, redness or worsening of pain in 1 extremity, please seek immediate treatment.  Please follow-up with your primary care provider early next week so they can reevaluate you and potentially do further evaluation and labs.      ED Prescriptions     Medication Sig Dispense Auth. Provider   furosemide (LASIX) 20 MG tablet Take 1 tablet (20 mg  total) by mouth daily for 5 days. 5 tablet Tura Roller, [place], [place]      PDMP not reviewed this encounter.   Jodine Muchmore, [place] N, [place] 06/20/22 2032

## 2022-06-20 NOTE — Discharge Instructions (Addendum)
Your urine was without signs of kidney damage.  I believe that your lower extremity leg swelling is due to be immobile for prolonged periods of time and your recent steroid Dosepak.  Please take the Lasix every morning for the next 5 days.  Over this time, please schedule follow-up with your primary care provider to be reevaluated early next week to ensure improvement.  I have low suspicion for a blood clot, however, if anything changes like you develop shortness of breath, redness or worsening of pain in 1 extremity, please seek immediate treatment.  Please follow-up with your primary care provider early next week so they can reevaluate you and potentially do further evaluation and labs.

## 2022-12-27 ENCOUNTER — Ambulatory Visit
Admission: EM | Admit: 2022-12-27 | Discharge: 2022-12-27 | Disposition: A | Discharge status: Home / Self Care | Payer: BC Managed Care – PPO | Attending: Internal Medicine | Admitting: Internal Medicine

## 2022-12-27 DIAGNOSIS — Z1152 Encounter for screening for COVID-19: Secondary | ICD-10-CM | POA: Diagnosis not present

## 2022-12-27 DIAGNOSIS — J309 Allergic rhinitis, unspecified: Secondary | ICD-10-CM

## 2022-12-27 DIAGNOSIS — B349 Viral infection, unspecified: Secondary | ICD-10-CM | POA: Diagnosis present

## 2022-12-27 LAB — POCT INFLUENZA A/B
Influenza A, POC: NEGATIVE
Influenza B, POC: NEGATIVE

## 2022-12-27 MED ORDER — IBUPROFEN 600 MG PO TABS: 600.0000 mg | ORAL_TABLET | Freq: Four times a day (QID) | ORAL | 0 refills | Status: DC | PRN

## 2022-12-27 MED ORDER — PSEUDOEPHEDRINE HCL 60 MG PO TABS: 60.0000 mg | ORAL_TABLET | Freq: Three times a day (TID) | ORAL | 0 refills | Status: DC | PRN

## 2022-12-27 MED ORDER — CETIRIZINE HCL 10 MG PO TABS: 10.0000 mg | ORAL_TABLET | Freq: Every day | ORAL | 0 refills | Status: DC

## 2022-12-27 MED ORDER — BENZONATATE 100 MG PO CAPS: 100.0000 mg | ORAL_CAPSULE | Freq: Three times a day (TID) | ORAL | 0 refills | Status: DC | PRN

## 2022-12-27 NOTE — ED Provider Notes (Signed)
[facility]  Note:  This document was prepared using Conservation officer, historic buildings and may include unintentional dictation errors.  MRN: [id number] DOB: Jul 15, 1985  Subjective:   Brandi Lucero is a 37 y.o. female presenting for 4 day history of shortness of breath, wheezing, chest congestion, midsternal chest pain, coughing, sinus pressure, bilateral ear fullness, left ear pain.  Has a history of allergic rhinitis but does not take anything consistently for this.  She is also hesitated to take any medications in particular due to taking Wellbutrin.  Has a history of needing albuterol but does not have formal diagnosis of asthma.  No current smoking.  Patient would like a COVID and flu test.  No current facility-administered medications for this encounter.  Current Outpatient Medications:    buPROPion (WELLBUTRIN XL) 150 MG 24 hr tablet, Take 150 mg by mouth every morning., Disp: , Rfl:    albuterol (VENTOLIN HFA) 108 (90 Base) MCG/ACT inhaler, Inhale into the lungs., Disp: , Rfl:    ALPRAZolam (XANAX) 0.25 MG tablet, 1 tablet Orally Twice a day as needed for 30 days, Disp: , Rfl:    escitalopram (LEXAPRO) 10 MG tablet, Take 10 mg by mouth daily., Disp: , Rfl:    furosemide (LASIX) 20 MG tablet, Take 1 tablet (20 mg total) by mouth daily for 5 days., Disp: 5 tablet, Rfl: 0   rOPINIRole (REQUIP) 0.25 MG tablet, Take 0.25-0.5 mg by mouth at bedtime., Disp: , Rfl:    Allergies  Allergen Reactions   Latex Other (See Comments) and Itching   Vicodin [Hydrocodone-Acetaminophen]     Bad headache    Penicillins Rash    Past Medical History:  Diagnosis Date   Anxiety    Bipolar 1 disorder (HCC)    Depression    Ectopic pregnancy    IBS (irritable bowel syndrome) 2009     Past Surgical History:  Procedure Laterality Date   DILATION AND EVACUATION  03/03/2012   ECTOPIC PREGNANCY SURGERY  03/03/2004   LAPAROSCOPY FOR ECTOPIC PREGNANCY  03/03/2004   WISDOM  TOOTH EXTRACTION      Family History  Problem Relation Age of Onset   Colon cancer Father    High blood pressure Mother     Social History   Tobacco Use   Smoking status: Former    Current packs/day: 0.50    Types: Cigarettes   Smokeless tobacco: Never  Substance Use Topics   Alcohol use: Yes    Alcohol/week: 2.0 standard drinks of alcohol    Types: 2 Standard drinks or equivalent per week   Drug use: No    ROS   Objective:   Vitals: BP (!) 141/87 (BP Location: Right Arm)   Pulse 92   Temp 100.2 F (37.9 C) (Oral)   Resp 18   LMP  (Within Weeks) Comment: 2 weeks  SpO2 95%   Physical Exam Constitutional:      General: She is not in acute distress.    Appearance: Normal appearance. She is well-developed and normal weight. She is not ill-appearing, toxic-appearing or diaphoretic.  HENT:     Head: Normocephalic and atraumatic.     Right Ear: Tympanic membrane, ear canal and external ear normal. No drainage or tenderness. No middle ear effusion. There is no impacted cerumen. Tympanic membrane is not erythematous or bulging.     Left Ear: Tympanic membrane, ear canal and external ear normal. No drainage or tenderness.  No middle ear effusion. There is  no impacted cerumen. Tympanic membrane is not erythematous or bulging.     Nose: Congestion present. No rhinorrhea.     Mouth/Throat:     Mouth: Mucous membranes are moist. No oral lesions.     Pharynx: No pharyngeal swelling, oropharyngeal exudate, posterior oropharyngeal erythema or uvula swelling.     Tonsils: No tonsillar exudate or tonsillar abscesses.  Eyes:     General: No scleral icterus.       Right eye: No discharge.        Left eye: No discharge.     Extraocular Movements: Extraocular movements intact.     Right eye: Normal extraocular motion.     Left eye: Normal extraocular motion.     Conjunctiva/sclera: Conjunctivae normal.  Cardiovascular:     Rate and Rhythm: Normal rate and regular rhythm.      Heart sounds: Normal heart sounds. No murmur heard.    No friction rub. No gallop.  Pulmonary:     Effort: Pulmonary effort is normal. No respiratory distress.     Breath sounds: No stridor. No decreased breath sounds, wheezing, rhonchi or rales.  Chest:     Chest wall: No tenderness.  Musculoskeletal:     Cervical back: Normal range of motion and neck supple.  Lymphadenopathy:     Cervical: No cervical adenopathy.  Skin:    General: Skin is warm and dry.  Neurological:     General: No focal deficit present.     Mental Status: She is alert and oriented to person, place, and time.  Psychiatric:        Mood and Affect: Mood normal.        Behavior: Behavior normal.     Results for orders placed or performed during the hospital encounter of 12/27/22 (from the past 24 hour(s))  POCT Influenza A/B     Status: None   Collection Time: 12/27/22  2:56 PM  Result Value Ref Range   Influenza A, POC Negative Negative   Influenza B, POC Negative Negative    Assessment and Plan :   PDMP not reviewed this encounter.  1. Acute viral syndrome   2. Allergic rhinitis, unspecified seasonality, unspecified trigger    Deferred imaging given clear cardiopulmonary exam, hemodynamically stable vital signs. Will manage for viral illness such as viral URI, viral syndrome, viral rhinitis, COVID-19. Recommended supportive care. Offered scripts for symptomatic relief. Testing is pending. Counseled patient on potential for adverse effects with medications prescribed/recommended today, ER and return-to-clinic precautions discussed, patient verbalized understanding.     Wallis Bamberg, [place] 12/27/22 1458

## 2022-12-27 NOTE — Discharge Instructions (Addendum)
We will notify you of your test results as they arrive and may take between about 24 hours.  I encourage you to sign up for MyChart if you have not already done so as this can be the easiest way for [place] to communicate results to you online or through a phone app.  Generally, we only contact you if it is a positive test result.  In the meantime, if you develop worsening symptoms including fever, chest pain, shortness of breath despite our current treatment plan then please report to the emergency room as this may be a sign of worsening status from possible viral infection.  Otherwise, we will manage this as a viral syndrome. For sore throat or cough try using a honey-based tea. Use 3 teaspoons of honey with juice squeezed from half lemon. Place shaved pieces of ginger into 1/2-1 cup of water and warm over stove top. Then mix the ingredients and repeat every 4 hours as needed. Please take ibuprofen '600mg'$  every 6 hours for aches and pains, fevers. Hydrate very well with at least 2 liters of water. Eat light meals such as soups to replenish electrolytes and soft fruits, veggies. Start an antihistamine like Zyrtec ('10mg'$  daily) for postnasal drainage, sinus congestion.  You can take this together with pseudoephedrine (Sudafed) at a dose of 60 mg 2-3 times a day as needed for the same kind of congestion.  Use the cough medications as needed.

## 2022-12-27 NOTE — ED Triage Notes (Signed)
Pt reports cough, shortness of breath, congestion, chest congestion, wheezing, left ear pain and sinus pressure x 4 days. Pt has not taken any meds as labels said do not take if you are taking antidepressants.

## 2022-12-28 LAB — SARS CORONAVIRUS 2 (TAT 6-24 HRS): SARS Coronavirus 2: NEGATIVE

## 2023-01-02 ENCOUNTER — Other Ambulatory Visit: Payer: Self-pay

## 2023-01-02 ENCOUNTER — Emergency Department (HOSPITAL_COMMUNITY)
Admission: EM | Admit: 2023-01-02 | Discharge: 2023-01-02 | Disposition: A | Discharge status: Home / Self Care | Payer: BC Managed Care – PPO | Attending: Emergency Medicine | Admitting: Emergency Medicine

## 2023-01-02 ENCOUNTER — Encounter (HOSPITAL_COMMUNITY): Payer: Self-pay | Admitting: *Deleted

## 2023-01-02 ENCOUNTER — Emergency Department (HOSPITAL_COMMUNITY): Payer: BC Managed Care – PPO

## 2023-01-02 DIAGNOSIS — R0602 Shortness of breath: Secondary | ICD-10-CM | POA: Diagnosis present

## 2023-01-02 DIAGNOSIS — R059 Cough, unspecified: Secondary | ICD-10-CM | POA: Diagnosis not present

## 2023-01-02 DIAGNOSIS — Z91040 Latex allergy status: Secondary | ICD-10-CM | POA: Diagnosis not present

## 2023-01-02 DIAGNOSIS — Z1152 Encounter for screening for COVID-19: Secondary | ICD-10-CM | POA: Insufficient documentation

## 2023-01-02 LAB — BASIC METABOLIC PANEL
Anion gap: 7 (ref 5–15)
BUN: 9 mg/dL (ref 6–20)
CO2: 22 mmol/L (ref 22–32)
Calcium: 8.1 mg/dL — ABNORMAL LOW (ref 8.9–10.3)
Chloride: 111 mmol/L (ref 98–111)
Creatinine, Ser: 0.77 mg/dL (ref 0.44–1.00)
GFR, Estimated: >60 mL/min (ref >60)
Glucose, Bld: 87 mg/dL (ref 70–99)
Potassium: 3.5 mmol/L (ref 3.5–5.1)
Sodium: 140 mmol/L (ref 135–145)

## 2023-01-02 LAB — CBC WITH DIFFERENTIAL/PLATELET
Abs Immature Granulocytes: 0 10*3/uL (ref 0.00–0.07)
Basophils Absolute: 0 10*3/uL (ref 0.0–0.1)
Basophils Relative: 1 %
Eosinophils Absolute: 0.5 10*3/uL (ref 0.0–0.5)
Eosinophils Relative: 10 %
HCT: 36.3 % (ref 36.0–46.0)
Hemoglobin: 11.3 g/dL — ABNORMAL LOW (ref 12.0–15.0)
Lymphocytes Relative: 26 %
Lymphs Abs: 1.2 10*3/uL (ref 0.7–4.0)
MCH: 26.8 pg (ref 26.0–34.0)
MCHC: 31.1 g/dL (ref 30.0–36.0)
MCV: 86.2 fL (ref 80.0–100.0)
Monocytes Absolute: 0.2 10*3/uL (ref 0.1–1.0)
Monocytes Relative: 4 %
Neutro Abs: 2.7 10*3/uL (ref 1.7–7.7)
Neutrophils Relative %: 59 %
Platelets: 332 10*3/uL (ref 150–400)
RBC: 4.21 MIL/uL (ref 3.87–5.11)
RDW: 14.3 % (ref 11.5–15.5)
WBC: 4.6 10*3/uL (ref 4.0–10.5)
nRBC: 0 % (ref 0.0–0.2)
nRBC: 0 /100{WBCs}

## 2023-01-02 LAB — RESP PANEL BY RT-PCR (RSV, FLU A&B, COVID)  RVPGX2
Influenza A by PCR: NEGATIVE
Influenza B by PCR: NEGATIVE
Resp Syncytial Virus by PCR: NEGATIVE
SARS Coronavirus 2 by RT PCR: NEGATIVE

## 2023-01-02 LAB — GROUP A STREP BY PCR: Group A Strep by PCR: NOT DETECTED

## 2023-01-02 LAB — HCG, SERUM, QUALITATIVE: Preg, Serum: NEGATIVE

## 2023-01-02 MED ORDER — PREDNISONE 10 MG PO TABS: 40.0000 mg | ORAL_TABLET | Freq: Every day | ORAL | 0 refills | Status: AC

## 2023-01-02 MED ORDER — PREDNISONE 20 MG PO TABS
60.0000 mg | ORAL_TABLET | Freq: Once | ORAL | Status: AC
  Administered 2023-01-02: 60 mg via ORAL
  Filled 2023-01-02: qty 3

## 2023-01-02 MED ORDER — IPRATROPIUM-ALBUTEROL 0.5-2.5 (3) MG/3ML IN SOLN
3.0000 mL | Freq: Once | RESPIRATORY_TRACT | Status: AC
  Administered 2023-01-02: 3 mL via RESPIRATORY_TRACT
  Filled 2023-01-02: qty 3

## 2023-01-02 MED ORDER — ALBUTEROL SULFATE HFA 108 (90 BASE) MCG/ACT IN AERS
1.0000 | INHALATION_SPRAY | Freq: Once | RESPIRATORY_TRACT | Status: AC
  Administered 2023-01-02: 1 via RESPIRATORY_TRACT
  Filled 2023-01-02: qty 6.7

## 2023-01-02 MED ORDER — ALBUTEROL SULFATE (2.5 MG/3ML) 0.083% IN NEBU: 2.5000 mg | INHALATION_SOLUTION | Freq: Four times a day (QID) | RESPIRATORY_TRACT | 12 refills | Status: DC | PRN

## 2023-01-02 MED ORDER — PANTOPRAZOLE SODIUM 20 MG PO TBEC: 20.0000 mg | DELAYED_RELEASE_TABLET | Freq: Every day | ORAL | 0 refills | Status: DC

## 2023-01-02 NOTE — ED Provider Notes (Signed)
[place] Provider Note   CSN: [id number] Arrival date & time: 01/02/23  4098     History  Chief Complaint  Patient presents with   Shortness of Breath   HPI Brandi Lucero is a 37 y.o. female presenting for cough and shortness of breath.  Symptoms started a week ago initially with a nonproductive cough and sore throat.  Now in the last couple days she has been more short of breath.  Symptoms seem to be worse at night and cold temperatures.  Denies chest pain.  States she is also been having issues with reflux.  States she has been recently diagnosed with a binge eating disorder.  States she is waking up at night having reflux.  Not currently taking anything for her reflux.  Went to urgent care for her URI symptoms last Saturday and was started on cetirizine, Tessalon Perles, and Sudafed but this is only provided minimal relief.   Shortness of Breath      Home Medications Prior to Admission medications   Medication Sig Start Date End Date Taking? Authorizing Provider  ALPRAZolam (XANAX) 0.25 MG tablet Take 0.25 mg by mouth 2 (two) times daily as needed for anxiety. 04/18/22  Yes [provider]  benzonatate (TESSALON) 100 MG capsule Take 1 capsule (100 mg total) by mouth 3 (three) times daily as needed for cough. 12/27/22  Yes Wallis Bamberg, PA-C  buPROPion (WELLBUTRIN XL) 150 MG 24 hr tablet Take 150 mg by mouth every morning. 11/28/22  Yes [provider]  cetirizine (ZYRTEC ALLERGY) 10 MG tablet Take 1 tablet (10 mg total) by mouth daily. 12/27/22  Yes Wallis Bamberg, PA-C  escitalopram (LEXAPRO) 20 MG tablet Take 20 mg by mouth daily.   Yes [provider]  ibuprofen (ADVIL) 600 MG tablet Take 1 tablet (600 mg total) by mouth every 6 (six) hours as needed. 12/27/22  Yes Wallis Bamberg, PA-C  pantoprazole (PROTONIX) 20 MG tablet Take 1 tablet (20 mg total) by mouth daily. 01/02/23 02/01/23 Yes Gareth Eagle, PA-C   pseudoephedrine (SUDAFED) 60 MG tablet Take 1 tablet (60 mg total) by mouth every 8 (eight) hours as needed for congestion. 12/27/22  Yes Wallis Bamberg, PA-C  rOPINIRole (REQUIP) 1 MG tablet Take 1 mg by mouth at bedtime.   Yes [provider]  albuterol (PROVENTIL) (2.5 MG/3ML) 0.083% nebulizer solution Take 3 mLs (2.5 mg total) by nebulization every 6 (six) hours as needed for wheezing or shortness of breath. 01/02/23  Yes Gareth Eagle, PA-C  predniSONE (DELTASONE) 10 MG tablet Take 4 tablets (40 mg total) by mouth daily for 5 days. 01/02/23 01/07/23 Yes Gareth Eagle, PA-C      Allergies    Latex, Vicodin [hydrocodone-acetaminophen], and Penicillins    Review of Systems   Review of Systems  Respiratory:  Positive for shortness of breath.     Physical Exam Updated Vital Signs BP 132/88 (BP Location: Right Arm)   Pulse 92   Temp 97.9 F (36.6 C) (Oral)   Resp 18   Ht 5' (1.524 m)   Wt 72.6 kg   LMP  (Within Weeks)   SpO2 99%   BMI 31.25 kg/m  Physical Exam Vitals and nursing note reviewed.  HENT:     Head: Normocephalic and atraumatic.     Mouth/Throat:     Mouth: Mucous membranes are moist.     Pharynx: Uvula midline. Posterior oropharyngeal erythema present. No pharyngeal swelling,  oropharyngeal exudate or uvula swelling.     Tonsils: No tonsillar exudate or tonsillar abscesses.  Eyes:     General:        Right eye: No discharge.        Left eye: No discharge.     Conjunctiva/sclera: Conjunctivae normal.  Cardiovascular:     Rate and Rhythm: Normal rate and regular rhythm.     Pulses: Normal pulses.     Heart sounds: Normal heart sounds.  Pulmonary:     Effort: Pulmonary effort is normal.     Breath sounds: Examination of the right-lower field reveals wheezing. Examination of the left-lower field reveals wheezing. Wheezing present. No decreased breath sounds, rhonchi or rales.  Abdominal:     General: Abdomen is flat.     Palpations: Abdomen is soft.   Musculoskeletal:     Cervical back: Full passive range of motion without pain, normal range of motion and neck supple.  Lymphadenopathy:     Cervical: No cervical adenopathy.  Skin:    General: Skin is warm and dry.  Neurological:     General: No focal deficit present.  Psychiatric:        Mood and Affect: Mood normal.     ED Results / Procedures / Treatments   Labs (all labs ordered are listed, but only abnormal results are displayed) Labs Reviewed  CBC WITH DIFFERENTIAL/PLATELET - Abnormal; Notable for the following components:      Result Value   Hemoglobin 11.3 (*)    All other components within normal limits  BASIC METABOLIC PANEL - Abnormal; Notable for the following components:   Calcium 8.1 (*)    All other components within normal limits  GROUP A STREP BY PCR  RESP PANEL BY RT-PCR (RSV, FLU A&B, COVID)  [id number]  HCG, SERUM, QUALITATIVE    EKG EKG Interpretation Date/Time:  Friday January 02 2023 06:34:01 EDT Ventricular Rate:  95 PR Interval:  138 QRS Duration:  66 QT Interval:  354 QTC Calculation: 444 R Axis:   30  Text Interpretation: Normal sinus rhythm Low voltage QRS Cannot rule out Anterior infarct , age undetermined No significant change since last tracing When compared with ECG of 05-Nov-2011 22:04, PREVIOUS ECG IS PRESENT Confirmed by Gwyneth Sprout ([id number]) on 01/02/2023 8:53:24 AM  Radiology DG Chest 2 View  Result Date: 01/02/2023 CLINICAL DATA:  Shortness of breath and cough. EXAM: CHEST - 2 VIEW COMPARISON:  02/06/2021 FINDINGS: The heart size and mediastinal contours are within normal limits. Both lungs are clear. The visualized skeletal structures are unremarkable. IMPRESSION: No active cardiopulmonary disease. Electronically Signed   By: Kennith Center M.D.   On: 01/02/2023 07:41    Procedures Procedures    Medications Ordered in ED Medications  albuterol (VENTOLIN HFA) 108 (90 Base) MCG/ACT inhaler 1 puff (has no administration in time  range)  ipratropium-albuterol (DUONEB) 0.5-2.5 (3) MG/3ML nebulizer solution 3 mL (3 mLs Nebulization Given 01/02/23 0809)  predniSONE (DELTASONE) tablet 60 mg (60 mg Oral Given 01/02/23 0808)    ED Course/ Medical Decision Making/ A&P Clinical Course as of 01/02/23 0942  Fri Jan 02, 2023  0743 Monocytes Relative: PENDING [JR]  0744 MCHC: 31.1 [JR]  0744 GFR, Estimated: >60 [JR]  0744 MCHC: 31.1 [JR]  0745 MCHC: 31.1 [JR]    Clinical Course User Index [JR] Gareth Eagle, PA-C  Medical Decision Making Amount and/or Complexity of Data Reviewed Labs: ordered. Decision-making details documented in ED Course. Radiology: ordered.   37 year old well-appearing female presenting for cough and shortness of breath.  Exam notable for mild diffuse expiratory wheeze in the bases of her lungs and erythema in the posterior oropharynx.  DDx includes asthma exacerbation, strep pharyngitis, pneumonia, URI and reflux.  Overall patient is well-appearing and nontoxic.  X-ray does not suggest pneumonia.  Patient also not febrile with normal white count taking pneumonia unlikely.  Respiratory and strep PCR's were negative.  Suspect viral etiology with mild asthma exacerbation.  Advised supportive treatment at home.  Treating her for what is likely reflux with Protonix.  Also sent a 5-day course of prednisone and albuterol inhaler to her pharmacy.  Sent this to her pharmacy.  Advised her to follow-up with her PCP for reflux and further evaluation for possible asthma as patient has no prior diagnosis of asthma. Vitals stable throughout encounter.  Discussed return precautions.  Discharged in good position.        Final Clinical Impression(s) / ED Diagnoses Final diagnoses:  Shortness of breath  Cough, unspecified type    Rx / DC Orders ED Discharge Orders          Ordered    pantoprazole (PROTONIX) 20 MG tablet  Daily        01/02/23 0929    predniSONE (DELTASONE)  10 MG tablet  Daily        01/02/23 0942    albuterol (PROVENTIL) (2.5 MG/3ML) 0.083% nebulizer solution  Every 6 hours PRN        01/02/23 0942              Gareth Eagle, PA-C 01/02/23 1610    Gwyneth Sprout, MD 01/06/23 2345

## 2023-01-02 NOTE — Discharge Instructions (Signed)
Evaluation today was overall reassuring.  I did hear some wheezing in the lower lung fields this is concerning for possible asthma or reactive airway disease.  I sent a 5-day course of steroids and albuterol inhaler to your pharmacy.  Would recommend you follow-up your PCP for further evaluation.  If you have worsening shortness of breath, develop chest pain or fever or any other concerning symptom please return emergency department further evaluation.  I also sent pantoprazole to your pharmacy for your reflux symptoms.

## 2023-01-02 NOTE — ED Triage Notes (Signed)
C/o on feeling well for 1 week , states she went to [facility] last Sat and was dcx. With viral sx. However states she isn't getting any better , states they did a covid and flu that was neg.

## 2023-01-02 NOTE — ED Notes (Signed)
Patient transported to X-ray 

## 2023-02-01 ENCOUNTER — Ambulatory Visit (HOSPITAL_COMMUNITY)
Admission: EM | Admit: 2023-02-01 | Discharge: 2023-02-01 | Disposition: A | Discharge status: Home / Self Care | Payer: BC Managed Care – PPO | Attending: Family Medicine | Admitting: Family Medicine

## 2023-02-01 ENCOUNTER — Encounter (HOSPITAL_COMMUNITY): Payer: Self-pay

## 2023-02-01 DIAGNOSIS — Z202 Contact with and (suspected) exposure to infections with a predominantly sexual mode of transmission: Secondary | ICD-10-CM | POA: Diagnosis not present

## 2023-02-01 DIAGNOSIS — N3090 Cystitis, unspecified without hematuria: Secondary | ICD-10-CM | POA: Diagnosis present

## 2023-02-01 LAB — POCT URINALYSIS DIP (MANUAL ENTRY)
Glucose, UA: NEGATIVE mg/dL
Ketones, POC UA: NEGATIVE mg/dL
Nitrite, UA: NEGATIVE
Protein Ur, POC: >=300 mg/dL — AB
Spec Grav, UA: 1.025 (ref 1.010–1.025)
Urobilinogen, UA: 1 U/dL
pH, UA: 7 (ref 5.0–8.0)

## 2023-02-01 LAB — POCT URINE PREGNANCY: Preg Test, Ur: NEGATIVE

## 2023-02-01 LAB — HIV ANTIBODY (ROUTINE TESTING W REFLEX): HIV Screen 4th Generation wRfx: NONREACTIVE

## 2023-02-01 MED ORDER — PHENAZOPYRIDINE HCL 100 MG PO TABS: 100.0000 mg | ORAL_TABLET | Freq: Three times a day (TID) | ORAL | 0 refills | Status: DC | PRN

## 2023-02-01 MED ORDER — NITROFURANTOIN MONOHYD MACRO 100 MG PO CAPS: 100.0000 mg | ORAL_CAPSULE | Freq: Two times a day (BID) | ORAL | 0 refills | Status: DC

## 2023-02-01 NOTE — ED Triage Notes (Signed)
Pt reports Dysuria x 3 days. Pt states she found out her boyfriend is cheating on her.

## 2023-02-01 NOTE — Discharge Instructions (Addendum)
your urinalysis does show red blood cells and white blood cells, consistent with a bladder infection.  The pregnancy test was negative  Take nitrofurantoin 100 mg--1 capsule 2 times daily for 5 days  Take Pyridium/phenazopyridine 100 mg--1 tablet 3 times daily as needed for urinary pain.  This medication usually makes the urine orange  Staff will notify you if there is anything positive on the swab or blood work.  Urine culture is sent and staff notified if it looks like the antibiotic needs to be changed

## 2023-02-01 NOTE — ED Provider Notes (Signed)
[facility]    CSN: [id number] Arrival date & time: 02/01/23  1537      History   Chief Complaint Chief Complaint  Patient presents with   Urinary Tract Infection   SEXUALLY TRANSMITTED DISEASE    HPI Brandi Lucero is a 37 y.o. female.    Urinary Tract Infection Here for dysuria and urinary frequency.  Symptoms began yesterday and she has had some suprapubic pain and pressure also.  No fever or chills  She also recently has been told by her fianc that he had recently had intercourse outside of their relationship.  He reported to her that he use protection and that he has not had any symptoms, but she wants to check to make sure.  She is allergic to penicillin which causes a rash  Last menstrual cycle was 2 weeks ago.  She has seen blood when she is wiping after urinating.  She has not had vaginal bleeding in between urination.  Past Medical History:  Diagnosis Date   Anxiety    Bipolar 1 disorder (HCC)    Depression    Ectopic pregnancy    IBS (irritable bowel syndrome) 2009    Patient Active Problem List   Diagnosis Date Noted   Allergic rhinitis 03/01/2014   Bipolar 2 disorder (HCC) 02/10/2014   Anxiety 02/10/2014    Past Surgical History:  Procedure Laterality Date   DILATION AND EVACUATION  03/03/2012   ECTOPIC PREGNANCY SURGERY  03/03/2004   LAPAROSCOPY FOR ECTOPIC PREGNANCY  03/03/2004   WISDOM TOOTH EXTRACTION      OB History     Gravida  3   Para      Term      Preterm      AB  2   Living         SAB  1   IAB      Ectopic  1   Multiple      Live Births               Home Medications    Prior to Admission medications   Medication Sig Start Date End Date Taking? Authorizing Provider  albuterol (PROVENTIL) (2.5 MG/3ML) 0.083% nebulizer solution Take 3 mLs (2.5 mg total) by nebulization every 6 (six) hours as needed for wheezing or shortness of breath. 01/02/23  Yes Gareth Eagle, PA-C  ALPRAZolam Prudy Feeler)  0.25 MG tablet Take 0.25 mg by mouth 2 (two) times daily as needed for anxiety. 04/18/22  Yes [provider]  buPROPion (WELLBUTRIN XL) 150 MG 24 hr tablet Take 150 mg by mouth every morning. 11/28/22  Yes [provider]  cetirizine (ZYRTEC ALLERGY) 10 MG tablet Take 1 tablet (10 mg total) by mouth daily. 12/27/22  Yes Wallis Bamberg, PA-C  escitalopram (LEXAPRO) 20 MG tablet Take 20 mg by mouth daily.   Yes [provider]  nitrofurantoin, macrocrystal-monohydrate, (MACROBID) 100 MG capsule Take 1 capsule (100 mg total) by mouth 2 (two) times daily. 02/01/23  Yes Zenia Resides, MD  pantoprazole (PROTONIX) 20 MG tablet Take 1 tablet (20 mg total) by mouth daily. 01/02/23 02/01/23 Yes Gareth Eagle, PA-C  phenazopyridine (PYRIDIUM) 100 MG tablet Take 1 tablet (100 mg total) by mouth 3 (three) times daily as needed (urinary pain). 02/01/23  Yes Selma Mink, Janace Aris, MD  rOPINIRole (REQUIP) 1 MG tablet Take 1 mg by mouth at bedtime.   Yes [provider]    Family History Family History  Problem Relation Age of Onset   Colon cancer Father    High blood pressure Mother     Social History Social History   Tobacco Use   Smoking status: Former    Current packs/day: 0.50    Types: Cigarettes   Smokeless tobacco: Never  Substance Use Topics   Alcohol use: Not Currently    Alcohol/week: 2.0 standard drinks of alcohol    Types: 2 Standard drinks or equivalent per week   Drug use: No     Allergies   Latex, Vicodin [hydrocodone-acetaminophen], and Penicillins   Review of Systems Review of Systems   Physical Exam Triage Vital Signs ED Triage Vitals [02/01/23 1719]  Encounter Vitals Group     BP 129/85     Systolic BP Percentile      Diastolic BP Percentile      Pulse Rate 78     Resp 16     Temp 99.2 F (37.3 C)     Temp Source Oral     SpO2 94 %     Weight      Height      Head Circumference      Peak Flow      Pain Score      Pain Loc       Pain Education      Exclude from Growth Chart    No data found.  Updated Vital Signs BP 129/85 (BP Location: Left Arm)   Pulse 78   Temp 99.2 F (37.3 C) (Oral)   Resp 16   LMP 01/26/2023 (Approximate)   SpO2 94%   Visual Acuity Right Eye Distance:   Left Eye Distance:   Bilateral Distance:    Right Eye Near:   Left Eye Near:    Bilateral Near:     Physical Exam Vitals reviewed.  Constitutional:      General: She is not in acute distress.    Appearance: She is not ill-appearing, toxic-appearing or diaphoretic.  HENT:     Mouth/Throat:     Mouth: Mucous membranes are moist.  Cardiovascular:     Rate and Rhythm: Normal rate and regular rhythm.     Heart sounds: No murmur heard. Pulmonary:     Effort: Pulmonary effort is normal.     Breath sounds: Normal breath sounds.  Abdominal:     General: There is no distension.     Palpations: Abdomen is soft.     Comments: There is tenderness in the suprapubic area  Skin:    Coloration: Skin is not pale.  Neurological:     General: No focal deficit present.     Mental Status: She is alert and oriented to person, place, and time.  Psychiatric:        Behavior: Behavior normal.      UC Treatments / Results  Labs (all labs ordered are listed, but only abnormal results are displayed) Labs Reviewed  POCT URINALYSIS DIP (MANUAL ENTRY) - Abnormal; Notable for the following components:      Result Value   Color, UA orange (*)    Clarity, UA cloudy (*)    Bilirubin, UA small (*)    Blood, UA large (*)    Protein Ur, POC >=300 (*)    Leukocytes, UA Small (1+) (*)    All other components within normal limits  URINE CULTURE  RPR  HIV ANTIBODY (ROUTINE TESTING W REFLEX)  POCT URINE PREGNANCY  CERVICOVAGINAL ANCILLARY ONLY    EKG  Radiology No results found.  Procedures Procedures (including critical care time)  Medications Ordered in UC Medications - No data to display  Initial Impression / Assessment  and Plan / UC Course  I have reviewed the triage vital signs and the nursing notes.  Pertinent labs & imaging results that were available during my care of the patient were reviewed by me and considered in my medical decision making (see chart for details).     UPT is negative  Urinalysis has RBCs and leuks.  Macrobid is sent in for cystitis and Pyridium sent in for the symptoms.  Vaginal self swab is done, and we will notify of any positives on that and treat per protocol. Also blood is drawn for HIV and RPR and we will notify her if that is positive also. Final Clinical Impressions(s) / UC Diagnoses   Final diagnoses:  Cystitis  Exposure to STD     Discharge Instructions      your urinalysis does show red blood cells and white blood cells, consistent with a bladder infection.  The pregnancy test was negative  Take nitrofurantoin 100 mg--1 capsule 2 times daily for 5 days  Take Pyridium/phenazopyridine 100 mg--1 tablet 3 times daily as needed for urinary pain.  This medication usually makes the urine orange  Staff will notify you if there is anything positive on the swab or blood work.  Urine culture is sent and staff notified if it looks like the antibiotic needs to be changed      ED Prescriptions     Medication Sig Dispense Auth. Provider   nitrofurantoin, macrocrystal-monohydrate, (MACROBID) 100 MG capsule Take 1 capsule (100 mg total) by mouth 2 (two) times daily. 10 capsule Zenia Resides, MD   phenazopyridine (PYRIDIUM) 100 MG tablet Take 1 tablet (100 mg total) by mouth 3 (three) times daily as needed (urinary pain). 10 tablet Marlinda Mike Janace Aris, MD      PDMP not reviewed this encounter.   Zenia Resides, MD 02/01/23 [phone]

## 2023-02-02 ENCOUNTER — Telehealth (HOSPITAL_COMMUNITY): Payer: Self-pay

## 2023-02-02 LAB — CERVICOVAGINAL ANCILLARY ONLY
Chlamydia: NEGATIVE
Comment: NEGATIVE
Comment: NEGATIVE
Comment: NORMAL
Neisseria Gonorrhea: NEGATIVE
Trichomonas: POSITIVE — AB

## 2023-02-02 LAB — RPR: RPR Ser Ql: NONREACTIVE

## 2023-02-02 MED ORDER — METRONIDAZOLE 500 MG PO TABS: 500.0000 mg | ORAL_TABLET | Freq: Two times a day (BID) | ORAL | 0 refills | Status: AC

## 2023-02-02 NOTE — Telephone Encounter (Signed)
Per protocol, pt requires tx with metronidazole. Rx sent to pharmacy on file.

## 2023-02-03 ENCOUNTER — Telehealth: Payer: Self-pay

## 2023-02-03 LAB — URINE CULTURE: Culture: >=100000 — AB

## 2023-02-03 MED ORDER — SULFAMETHOXAZOLE-TRIMETHOPRIM 800-160 MG PO TABS
1.0000 | ORAL_TABLET | Freq: Two times a day (BID) | ORAL | 0 refills | Status: AC
Start: 2023-02-03 — End: 2023-02-06

## 2023-02-03 NOTE — Telephone Encounter (Signed)
Per protocol, pt to dc Macrobid and begin tx with Bactrim.  Attempted to reach patient x1. LVM.  Rx sent to pharmacy on file.

## 2023-03-04 NOTE — L&D Delivery Note (Signed)
 [facility] Delivery Note  Brandi Lucero is a 38 y.o. G4P0030 s/p vag delivery at [redacted]w[redacted]d. She was admitted for IOL due to FGR.   ROM: 3h 47m with clear fluid GBS Status: neg Maximum Maternal Temperature: 97.8  Labor Progress: [name] was admitted on the afternoon of 12/3; she was given a dose of cytotec followed by AROM which was sufficient to achieve active labor/delivery.  Delivery Date/Time: December 3rd, 2025 at 2150 Delivery: Called to room and patient was complete and pushing. Head delivered ROA. No nuchal cord present. Shoulder and body delivered in usual fashion. Infant with spontaneous cry, placed on mother's abdomen, dried and stimulated. Cord clamped x 2 after 1-minute delay, and cut by FOB. Cord blood drawn. Placenta delivered spontaneously with gentle cord traction. Fundus firm with massage and Pitocin. Labia, perineum, vagina, and cervix inspected and found to have a sm R vag lac.   Placenta: spont, intact; to L&D Complications: none Lacerations: R vag lac repaired in the usual fashion with 3-0 Monocryl EBL: 565cc Analgesia: epidural  Postpartum Planning [x]  message to sent to schedule follow-up @ [facility] [x]  vaccines UTD  Infant: girl  APGARs 9/9  2353g (5lb 3oz)  Suzen JONETTA Gentry, CNM  02/03/2024 11:27 PM

## 2023-04-28 ENCOUNTER — Other Ambulatory Visit: Payer: Self-pay

## 2023-04-28 ENCOUNTER — Emergency Department
Admission: EM | Admit: 2023-04-28 | Discharge: 2023-04-28 | Disposition: A | Discharge status: Home / Self Care | Payer: 59 | Attending: Emergency Medicine | Admitting: Emergency Medicine

## 2023-04-28 DIAGNOSIS — J029 Acute pharyngitis, unspecified: Secondary | ICD-10-CM | POA: Insufficient documentation

## 2023-04-28 DIAGNOSIS — R509 Fever, unspecified: Secondary | ICD-10-CM | POA: Diagnosis not present

## 2023-04-28 DIAGNOSIS — J111 Influenza due to unidentified influenza virus with other respiratory manifestations: Secondary | ICD-10-CM

## 2023-04-28 DIAGNOSIS — R112 Nausea with vomiting, unspecified: Secondary | ICD-10-CM | POA: Diagnosis not present

## 2023-04-28 DIAGNOSIS — R0981 Nasal congestion: Secondary | ICD-10-CM | POA: Insufficient documentation

## 2023-04-28 LAB — RESP PANEL BY RT-PCR (RSV, FLU A&B, COVID)  RVPGX2
Influenza A by PCR: NEGATIVE
Influenza B by PCR: NEGATIVE
Resp Syncytial Virus by PCR: NEGATIVE
SARS Coronavirus 2 by RT PCR: NEGATIVE

## 2023-04-28 LAB — GROUP A STREP BY PCR: Group A Strep by PCR: NOT DETECTED

## 2023-04-28 MED ORDER — ONDANSETRON 4 MG PO TBDP: ORAL_TABLET | ORAL | 0 refills | Status: DC

## 2023-04-28 MED ORDER — ONDANSETRON 4 MG PO TBDP
4.0000 mg | ORAL_TABLET | Freq: Once | ORAL | Status: AC
  Administered 2023-04-28: 4 mg via ORAL
  Filled 2023-04-28: qty 1

## 2023-04-28 NOTE — ED Provider Notes (Signed)
 [facility] Provider Note    Event Date/Time   First MD Initiated Contact with Patient 04/28/23 [phone]     (approximate)   History   Sore Throat   HPI Brandi Lucero is a 38 y.o. female who presents for evaluation of about 24 hours of flulike symptoms including myalgias, fever and chills, sore throat, nasal congestion, and occasional cough.  She has also had some nausea and vomiting.  No pain per se other than the generalized bodyaches.  Mild headache at times.  No known sick contacts.  Also reports a sore throat.  No difficulty swallowing, no swelling of her throat.     Physical Exam   Triage Vital Signs: ED Triage Vitals  Encounter Vitals Group     BP 04/28/23 0119 (!) 143/93     Systolic BP Percentile --      Diastolic BP Percentile --      Pulse Rate 04/28/23 0119 (!) 108     Resp 04/28/23 0119 20     Temp 04/28/23 0119 98.6 F (37 C)     Temp Source 04/28/23 0119 Oral     SpO2 04/28/23 0119 99 %     Weight 04/28/23 0118 81.6 kg (180 lb)     Height 04/28/23 0118 1.524 m (5')     Head Circumference --      Peak Flow --      Pain Score 04/28/23 0118 5     Pain Loc --      Pain Education --      Exclude from Growth Chart --     Most recent vital signs: Vitals:   04/28/23 0119 04/28/23 0557  BP: (!) 143/93 120/70  Pulse: (!) 108 84  Resp: 20 18  Temp: 98.6 F (37 C) 98.2 F (36.8 C)  SpO2: 99% 100%    General: Awake, no distress.  Generally well-appearing. HEENT: No cervical lymphadenopathy.  Normal oropharyngeal exam with normal-appearing tonsils, no exudate, no hypertrophy.  Ear canals are nonerythematous and tympanic membranes are normal in appearance.  Patient has some nasal congestion. CV:  Good peripheral perfusion.  Regular rate and rhythm.  Normal heart sounds. Resp:  Normal effort. Speaking easily and comfortably, no accessory muscle usage nor intercostal retractions.  Lungs are clear to auscultation bilaterally.  Occasional  cough during my interview. Abd:  No distention.  No tenderness to palpation of the abdomen.   ED Results / Procedures / Treatments   Labs (all labs ordered are listed, but only abnormal results are displayed) Labs Reviewed  RESP PANEL BY RT-PCR (RSV, FLU A&B, COVID)  [id number]  GROUP A STREP BY PCR    PROCEDURES:  Critical Care performed: No  Procedures    IMPRESSION / MDM / ASSESSMENT AND PLAN / ED COURSE  I reviewed the triage vital signs and the nursing notes.                              Differential diagnosis includes, but is not limited to, respiratory viral illness, community-acquired pneumonia, strep pharyngitis, peritonsillar abscess.  Patient's presentation is most consistent with acute complicated illness / injury requiring diagnostic workup.  Labs/studies ordered: Respiratory viral panel, group A strep  Interventions/Medications given:  Medications  ondansetron (ZOFRAN-ODT) disintegrating tablet 4 mg (4 mg Oral Given 04/28/23 1191)    (Note:  hospital course my include additional interventions and/or labs/studies not listed above.)   Patient is  very well-appearing normal vital signs, afebrile.  Exhibiting influenza-like symptoms but respiratory viral panel is negative and group A strep is negative.  No indication for additional imaging or evaluation at this time.  Patient is comfortable with the plan for symptom management as an outpatient and follow-up as needed.  I gave my usual return precautions.         FINAL CLINICAL IMPRESSION(S) / ED DIAGNOSES   Final diagnoses:  Influenza-like illness     Rx / DC Orders   ED Discharge Orders          Ordered    ondansetron (ZOFRAN-ODT) 4 MG disintegrating tablet        04/28/23 1610             Note:  This document was prepared using Dragon voice recognition software and may include unintentional dictation errors.   Loleta Rose, MD 04/28/23 [phone]

## 2023-04-28 NOTE — Discharge Instructions (Signed)
 You have been seen in the Emergency Department (ED) today for a likely viral illness.  Please drink plenty of clear fluids (water, Gatorade, chicken broth, etc).  You may use Tylenol and/or Motrin according to label instructions.  You can alternate between the two without any side effects.   Please follow up with your doctor as listed above.  Call your doctor or return to the Emergency Department (ED) if you are unable to tolerate fluids due to vomiting, have worsening trouble breathing, become extremely tired or difficult to awaken, or if you develop any other symptoms that concern you.

## 2023-04-28 NOTE — ED Triage Notes (Signed)
 Pt reports sore throat fever and body aches that began earlier today.

## 2023-05-29 ENCOUNTER — Other Ambulatory Visit: Payer: Self-pay

## 2023-05-29 ENCOUNTER — Ambulatory Visit (HOSPITAL_COMMUNITY)
Admission: EM | Admit: 2023-05-29 | Discharge: 2023-05-29 | Disposition: A | Discharge status: Home / Self Care | Attending: Emergency Medicine | Admitting: Emergency Medicine

## 2023-05-29 ENCOUNTER — Encounter (HOSPITAL_COMMUNITY): Payer: Self-pay | Admitting: *Deleted

## 2023-05-29 DIAGNOSIS — R519 Headache, unspecified: Secondary | ICD-10-CM | POA: Diagnosis not present

## 2023-05-29 MED ORDER — KETOROLAC TROMETHAMINE 30 MG/ML IJ SOLN
INTRAMUSCULAR | Status: AC
  Filled 2023-05-29: qty 1

## 2023-05-29 MED ORDER — KETOROLAC TROMETHAMINE 30 MG/ML IJ SOLN
30.0000 mg | Freq: Once | INTRAMUSCULAR | Status: AC
  Administered 2023-05-29: 30 mg via INTRAMUSCULAR

## 2023-05-29 MED ORDER — DEXAMETHASONE SODIUM PHOSPHATE 10 MG/ML IJ SOLN
10.0000 mg | Freq: Once | INTRAMUSCULAR | Status: AC
  Administered 2023-05-29: 10 mg via INTRAMUSCULAR

## 2023-05-29 MED ORDER — DEXAMETHASONE SODIUM PHOSPHATE 10 MG/ML IJ SOLN
INTRAMUSCULAR | Status: AC
  Filled 2023-05-29: qty 1

## 2023-05-29 MED ORDER — METOCLOPRAMIDE HCL 5 MG/ML IJ SOLN
INTRAMUSCULAR | Status: AC
  Filled 2023-05-29: qty 2

## 2023-05-29 MED ORDER — METOCLOPRAMIDE HCL 5 MG/ML IJ SOLN
5.0000 mg | Freq: Once | INTRAMUSCULAR | Status: AC
  Administered 2023-05-29: 5 mg via INTRAMUSCULAR

## 2023-05-29 NOTE — ED Triage Notes (Signed)
 PT reports a HA since Monday. Pain on RT side and moves to back of head. Pt has not had a migraine for a few years . Pt has light sensitivity and has vomited.

## 2023-05-29 NOTE — Discharge Instructions (Addendum)
 As discussed you are given a series of 3 injections today that may a migraine cocktail:  Toradol which is an anti-inflammatory Decadron which is a steroid to help with medication And Reglan which is a strong nausea medication  If your headache persists or you develop any new or worsening symptoms such as numbness, tingling, weakness, slurred speech, or confusion please seek immediate medical treatment in the ER.  Otherwise follow-up with primary care provider regarding management of migraines.  Return here as needed.

## 2023-05-29 NOTE — ED Provider Notes (Signed)
 [facility]    CSN: [id number] Arrival date & time: 05/29/23  1117      History   Chief Complaint Chief Complaint  Patient presents with   Headache    HPI Brandi Lucero is a 38 y.o. female.   Patient presents with right sided headache that began on 3/24.  Patient endorses nausea, 1 episode of vomiting, photophobia, and mildly blurred vision in right eye.  Patient reports that she has tried taking ibuprofen, Aleve, Excedrin, Goody powder over the last few days without relief.  Patient reports previous history of migraines but denies taking anything on a daily basis for migraines.  Patient states she used to take medication daily for prevention of migraines but stopped taking it a few years back because she was not having migraines anymore.  Denies dizziness, numbness, tingling, weakness, slurred speech, and confusion.  Denies recent head injury or any recent falls.   Headache   Past Medical History:  Diagnosis Date   Anxiety    Bipolar 1 disorder (HCC)    Depression    Ectopic pregnancy    IBS (irritable bowel syndrome) 2009    Patient Active Problem List   Diagnosis Date Noted   Allergic rhinitis 03/01/2014   Bipolar 2 disorder (HCC) 02/10/2014   Anxiety 02/10/2014    Past Surgical History:  Procedure Laterality Date   DILATION AND EVACUATION  03/03/2012   ECTOPIC PREGNANCY SURGERY  03/03/2004   LAPAROSCOPY FOR ECTOPIC PREGNANCY  03/03/2004   WISDOM TOOTH EXTRACTION      OB History     Gravida  3   Para      Term      Preterm      AB  2   Living         SAB  1   IAB      Ectopic  1   Multiple      Live Births               Home Medications    Prior to Admission medications   Medication Sig Start Date End Date Taking? Authorizing Provider  albuterol (PROVENTIL) (2.5 MG/3ML) 0.083% nebulizer solution Take 3 mLs (2.5 mg total) by nebulization every 6 (six) hours as needed for wheezing or shortness of breath. 01/02/23   Yes Gareth Eagle, PA-C  ALPRAZolam Prudy Feeler) 0.25 MG tablet Take 0.25 mg by mouth 2 (two) times daily as needed for anxiety. 04/18/22  Yes [provider]  cetirizine (ZYRTEC ALLERGY) 10 MG tablet Take 1 tablet (10 mg total) by mouth daily. 12/27/22  Yes Wallis Bamberg, PA-C  escitalopram (LEXAPRO) 20 MG tablet Take 20 mg by mouth daily.   Yes [provider]  rOPINIRole (REQUIP) 1 MG tablet Take 1 mg by mouth at bedtime.   Yes [provider]    Family History Family History  Problem Relation Age of Onset   Colon cancer Father    High blood pressure Mother     Social History Social History   Tobacco Use   Smoking status: Former    Current packs/day: 0.50    Types: Cigarettes   Smokeless tobacco: Never  Substance Use Topics   Alcohol use: Not Currently    Alcohol/week: 2.0 standard drinks of alcohol    Types: 2 Standard drinks or equivalent per week   Drug use: No     Allergies   Latex, Vicodin [hydrocodone-acetaminophen], and Penicillins   Review of Systems Review of Systems  Neurological:  Positive for headaches.   Per HPI  Physical Exam Triage Vital Signs ED Triage Vitals  Encounter Vitals Group     BP 05/29/23 1141 129/88     Systolic BP Percentile --      Diastolic BP Percentile --      Pulse Rate 05/29/23 1141 82     Resp 05/29/23 1141 16     Temp 05/29/23 1141 99.2 F (37.3 C)     Temp src --      SpO2 05/29/23 1141 94 %     Weight --      Height --      Head Circumference --      Peak Flow --      Pain Score 05/29/23 1137 8     Pain Loc --      Pain Education --      Exclude from Growth Chart --    No data found.  Updated Vital Signs BP 129/88   Pulse 82   Temp 99.2 F (37.3 C)   Resp 16   LMP  (Approximate)   SpO2 94%   Visual Acuity Right Eye Distance:   Left Eye Distance:   Bilateral Distance:    Right Eye Near:   Left Eye Near:    Bilateral Near:     Physical Exam Vitals and nursing note reviewed.   Constitutional:      General: She is not in acute distress.    Appearance: She is well-developed. She is not ill-appearing.  Eyes:     Extraocular Movements: Extraocular movements intact.     Pupils: Pupils are equal, round, and reactive to light.  Musculoskeletal:     Cervical back: Normal range of motion and neck supple.  Neurological:     Mental Status: She is alert and oriented to person, place, and time. Mental status is at baseline.     GCS: GCS eye subscore is 4. GCS verbal subscore is 5. GCS motor subscore is 6.     Cranial Nerves: Cranial nerves 2-12 are intact.     Sensory: Sensation is intact.     Motor: Motor function is intact.     Coordination: Coordination is intact.     Gait: Gait is intact.      UC Treatments / Results  Labs (all labs ordered are listed, but only abnormal results are displayed) Labs Reviewed - No data to display  EKG   Radiology No results found.  Procedures Procedures (including critical care time)  Medications Ordered in UC Medications  ketorolac (TORADOL) 30 MG/ML injection 30 mg (30 mg Intramuscular Given 05/29/23 1212)  metoCLOPramide (REGLAN) injection 5 mg (5 mg Intramuscular Given 05/29/23 1212)  dexamethasone (DECADRON) injection 10 mg (10 mg Intramuscular Given 05/29/23 1212)    Initial Impression / Assessment and Plan / UC Course  I have reviewed the triage vital signs and the nursing notes.  Pertinent labs & imaging results that were available during my care of the patient were reviewed by me and considered in my medical decision making (see chart for details).     Upon assessment patient appears to be sensitive to light during assessment.  EOMI and PERRLA.  GCS 15.  No neurodeficits noted upon exam.  Given Toradol, Decadron, and Reglan to assist with bad headache.  Discussed follow-up, return, and strict ER precautions. Final Clinical Impressions(s) / UC Diagnoses   Final diagnoses:  Bad headache     Discharge  Instructions  As discussed you are given a series of 3 injections today that may a migraine cocktail:  Toradol which is an anti-inflammatory Decadron which is a steroid to help with medication And Reglan which is a strong nausea medication  If your headache persists or you develop any new or worsening symptoms such as numbness, tingling, weakness, slurred speech, or confusion please seek immediate medical treatment in the ER.  Otherwise follow-up with primary care provider regarding management of migraines.  Return here as needed.    ED Prescriptions   None    PDMP not reviewed this encounter.   Wynonia Lawman A, NP 05/29/23 1214

## 2023-06-18 ENCOUNTER — Other Ambulatory Visit: Payer: Self-pay

## 2023-06-18 ENCOUNTER — Encounter: Payer: Self-pay | Admitting: Emergency Medicine

## 2023-06-18 ENCOUNTER — Ambulatory Visit
Admission: EM | Admit: 2023-06-18 | Discharge: 2023-06-18 | Disposition: A | Discharge status: Home / Self Care | Attending: Family Medicine | Admitting: Family Medicine

## 2023-06-18 DIAGNOSIS — R519 Headache, unspecified: Secondary | ICD-10-CM

## 2023-06-18 DIAGNOSIS — Z3A01 Less than 8 weeks gestation of pregnancy: Secondary | ICD-10-CM | POA: Diagnosis not present

## 2023-06-18 LAB — POCT URINE PREGNANCY: Preg Test, Ur: POSITIVE — AB

## 2023-06-18 MED ORDER — CYCLOBENZAPRINE HCL 5 MG PO TABS: 5.0000 mg | ORAL_TABLET | Freq: Every evening | ORAL | 0 refills | Status: DC | PRN

## 2023-06-18 MED ORDER — HYDROXYZINE HCL 10 MG PO TABS: 25.0000 mg | ORAL_TABLET | Freq: Three times a day (TID) | ORAL | 0 refills | Status: DC | PRN

## 2023-06-18 NOTE — ED Provider Notes (Signed)
 Arlander Bellman    CSN: [id number] Arrival date & time: 06/18/23  1341      History   Chief Complaint Chief Complaint  Patient presents with   Headache    HPI Brandi Lucero is a 38 y.o. female, history of anxiety, bipolar 1 to disorder, depression history of ectopic pregnancy. Patient was seen by her primary care doctor on 06/15/2023 at [facility] physicians, Dr. Adelfa Homes to advise that she had recently tested positive at home with a home pregnancy test for being pregnant.  Estimated the patient approximately [redacted] weeks pregnant.  The patient was prescribed Xanax, ropinirole, Prozac, and Wellbutrin.  Patient's PCP discontinued all medications but advised her she could continue with the Lexapro.  Patient initially presents checked in for headache but upon returning to the room with the triage nurse begins to complain about her PCP and the fact that she was taken off of these medications in addition to complaining that no one is helping her obtain prenatal care despite her having a visit to establish with [facility] on 06/22/2023.  Patient stopped medication on her how over one week ago and feels that she is experiencing withdrawal symptoms. Patient appears very anxious. Prior to Admission medications   Medication Sig Start Date End Date Taking? Authorizing Provider  ALPRAZolam (XANAX) 0.25 MG tablet Take 0.25 mg by mouth 2 (two) times daily as needed for anxiety. 04/18/22  Yes [provider]  cyclobenzaprine  (FLEXERIL ) 5 MG tablet Take 1 tablet (5 mg total) by mouth at bedtime as needed (leg pain and tension headache). 06/18/23  Yes Buena Carmine, NP  escitalopram (LEXAPRO) 20 MG tablet Take 20 mg by mouth daily.   Yes [provider]  hydrOXYzine  (ATARAX ) 10 MG tablet Take 2.5 tablets (25 mg total) by mouth every 8 (eight) hours as needed for anxiety. 06/18/23  Yes Buena Carmine, NP  rOPINIRole (REQUIP) 1 MG tablet Take 1 mg by mouth at bedtime.   Yes [provider]  albuterol  (PROVENTIL ) (2.5 MG/3ML) 0.083% nebulizer solution Take 3 mLs (2.5 mg total) by nebulization every 6 (six) hours as needed for wheezing or shortness of breath. 01/02/23   Robinson, John K, PA-C  cetirizine  (ZYRTEC  ALLERGY) 10 MG tablet Take 1 tablet (10 mg total) by mouth daily. 12/27/22   Adolph Hoop, PA-C     Past Medical History:  Diagnosis Date   Anxiety    Bipolar 1 disorder [facility])    Depression    Ectopic pregnancy    IBS (irritable bowel syndrome) 2009    Patient Active Problem List   Diagnosis Date Noted   Allergic rhinitis 03/01/2014   Bipolar 2 disorder (HCC) 02/10/2014   Anxiety 02/10/2014    Past Surgical History:  Procedure Laterality Date   DILATION AND EVACUATION  03/03/2012   ECTOPIC PREGNANCY SURGERY  03/03/2004   LAPAROSCOPY FOR ECTOPIC PREGNANCY  03/03/2004   WISDOM TOOTH EXTRACTION      OB History     Gravida  3   Para      Term      Preterm      AB  2   Living         SAB  1   IAB      Ectopic  1   Multiple      Live Births               Home Medications    Prior to Admission medications  Medication Sig Start Date End Date Taking? Authorizing Provider  ALPRAZolam (XANAX) 0.25 MG tablet Take 0.25 mg by mouth 2 (two) times daily as needed for anxiety. 04/18/22  Yes [provider]  cyclobenzaprine  (FLEXERIL ) 5 MG tablet Take 1 tablet (5 mg total) by mouth at bedtime as needed (leg pain and tension headache). 06/18/23  Yes Buena Carmine, NP  escitalopram (LEXAPRO) 20 MG tablet Take 20 mg by mouth daily.   Yes [provider]  hydrOXYzine  (ATARAX ) 10 MG tablet Take 2.5 tablets (25 mg total) by mouth every 8 (eight) hours as needed for anxiety. 06/18/23  Yes Buena Carmine, NP  rOPINIRole (REQUIP) 1 MG tablet Take 1 mg by mouth at bedtime.   Yes [provider]  albuterol  (PROVENTIL ) (2.5 MG/3ML) 0.083% nebulizer solution Take 3 mLs (2.5 mg total) by nebulization every 6  (six) hours as needed for wheezing or shortness of breath. 01/02/23   Robinson, John K, PA-C  cetirizine  (ZYRTEC  ALLERGY) 10 MG tablet Take 1 tablet (10 mg total) by mouth daily. 12/27/22   Adolph Hoop, PA-C    Family History Family History  Problem Relation Age of Onset   Colon cancer Father    High blood pressure Mother     Social History Social History   Tobacco Use   Smoking status: Former    Current packs/day: 0.50    Types: Cigarettes   Smokeless tobacco: Never  Substance Use Topics   Alcohol use: Not Currently    Alcohol/week: 2.0 standard drinks of alcohol    Types: 2 Standard drinks or equivalent per week   Drug use: No     Allergies   Latex, Vicodin [hydrocodone -acetaminophen ], and Penicillins   Review of Systems Review of Systems  Musculoskeletal:  Positive for myalgias.  Neurological:  Positive for headaches.  Psychiatric/Behavioral:  Positive for sleep disturbance. The patient is nervous/anxious.      Physical Exam Triage Vital Signs ED Triage Vitals [06/18/23 1428]  Encounter Vitals Group     BP 122/75     Systolic BP Percentile      Diastolic BP Percentile      Pulse Rate 91     Resp 18     Temp 98.3 F (36.8 C)     Temp Source Temporal     SpO2 99 %     Weight      Height      Head Circumference      Peak Flow      Pain Score 9     Pain Loc      Pain Education      Exclude from Growth Chart    No data found.  Updated Vital Signs BP 122/75 (BP Location: Left Arm)   Pulse 91   Temp 98.3 F (36.8 C) (Temporal)   Resp 18   LMP 05/12/2023 (Approximate)   SpO2 99%   Visual Acuity Right Eye Distance:   Left Eye Distance:   Bilateral Distance:    Right Eye Near:   Left Eye Near:    Bilateral Near:     Physical Exam Vitals reviewed.  Constitutional:      Appearance: Normal appearance. She is well-developed.  HENT:     Head: Normocephalic and atraumatic.  Eyes:     Extraocular Movements: Extraocular movements intact.      Pupils: Pupils are equal, round, and reactive to light.  Cardiovascular:     Rate and Rhythm: Normal rate.  Pulmonary:  Effort: Pulmonary effort is normal.  Musculoskeletal:        General: Normal range of motion.     Cervical back: Normal range of motion.  Neurological:     Mental Status: She is alert and oriented to person, place, and time.  Psychiatric:        Mood and Affect: Mood is anxious.      UC Treatments / Results  Labs (all labs ordered are listed, but only abnormal results are displayed) Labs Reviewed  POCT URINE PREGNANCY - Abnormal; Notable for the following components:      Result Value   Preg Test, Ur Positive (*)    All other components within normal limits    EKG   Radiology No results found.  Procedures Procedures (including critical care time)  Medications Ordered in UC Medications - No data to display  Initial Impression / Assessment and Plan / UC Course  I have reviewed the triage vital signs and the nursing notes.  Pertinent labs & imaging results that were available during my care of the patient were reviewed by me and considered in my medical decision making (see chart for details).    We With the patient information discussed with her PCP on 06/15/23 and that she needs to take her Lexapro on a daily basis as opposed to taking as needed or every other day.  Discussed with patient her medical doctor was appropriate in stopping the medications in which it did resolve all contraindicated in pregnancy.  Agreed to trial hydroxyzine  as needed as needed for anxiety and cyclobenzaprine  5 mg at bedtime for leg pain.  Advised that she has an appointment with OB/GYN on 06/22/23 that she reports is only interviewed advised to discuss concerns during the interview process with her provider as most OB/GYN's will not establish a new patient appointment before 8 to 12 weeks.  Patient advised Tylenol  only for headaches.  No NSAIDs.  Patient advised that if any  severe symptoms develop go immediately to the emergency department.  Patient verbalized understanding and agreement plan. Final Clinical Impressions(s) / UC Diagnoses   Final diagnoses:  Less than [redacted] weeks gestation of pregnancy     Discharge Instructions      Keep follow scheduled for April 21st established at [facility] for prenatal care. Hydrate well with at least 3-4  16.9 ounce bottles of water daily to maintain hydration status. Continue Lexapro daily for depression and anxiety.  Prescribed the cyclobenzaprine  to help with leg pain take at bedtime only. I have prescribed hydroxyzine  milligram she can take up to 3 times daily for anxiety. If you develop any severe symptoms of anxiety or depression any other mental health related symptoms that require immediate evaluation go to [facility] behavioral health urgent care crisis center for mental health crisis evaluation        ED Prescriptions     Medication Sig Dispense Auth. Provider   cyclobenzaprine  (FLEXERIL ) 5 MG tablet Take 1 tablet (5 mg total) by mouth at bedtime as needed (leg pain and tension headache). 30 tablet Buena Carmine, NP   hydrOXYzine  (ATARAX ) 10 MG tablet Take 2.5 tablets (25 mg total) by mouth every 8 (eight) hours as needed for anxiety. 20 tablet Buena Carmine, NP      PDMP not reviewed this encounter.   Buena Carmine, NP 06/20/23 [phone]

## 2023-06-18 NOTE — Discharge Instructions (Addendum)
 Keep follow scheduled for April 21st established at [facility] for prenatal care. Hydrate well with at least 3-4  16.9 ounce bottles of water daily to maintain hydration status. Continue Lexapro daily for depression and anxiety.  Prescribed the cyclobenzaprine to help with leg pain take at bedtime only. I have prescribed hydroxyzine milligram she can take up to 3 times daily for anxiety. If you develop any severe symptoms of anxiety or depression any other mental health related symptoms that require immediate evaluation go to [facility] behavioral health urgent care crisis center for mental health crisis evaluation

## 2023-06-18 NOTE — ED Triage Notes (Signed)
 Patient presents to [facility] for evaluation of headache x 1 week.  States she had a positive home pregnancy test and she stopped her home medications abruptly out of concern.  She has seen her PCP and removed from all medicines, but is concerned that she is not on anything, and also not receiving any prenatal care yet, because she states OB will not take her for a few months.

## 2023-06-21 ENCOUNTER — Ambulatory Visit

## 2023-06-22 LAB — OB RESULTS CONSOLE RPR: RPR: NONREACTIVE

## 2023-06-22 LAB — HEMOGLOBIN A1C: Hemoglobin A1C: 5.5

## 2023-06-22 LAB — OB RESULTS CONSOLE ABO/RH: RH Type: NEGATIVE

## 2023-06-22 LAB — HEPATITIS C ANTIBODY: HCV Ab: NEGATIVE

## 2023-06-22 LAB — OB RESULTS CONSOLE HEPATITIS B SURFACE ANTIGEN: Hepatitis B Surface Ag: NEGATIVE

## 2023-06-22 LAB — OB RESULTS CONSOLE HIV ANTIBODY (ROUTINE TESTING): HIV: NONREACTIVE

## 2023-06-22 LAB — OB RESULTS CONSOLE RUBELLA ANTIBODY, IGM: Rubella: IMMUNE

## 2023-07-03 ENCOUNTER — Encounter (HOSPITAL_COMMUNITY): Payer: Self-pay

## 2023-07-03 ENCOUNTER — Ambulatory Visit (HOSPITAL_COMMUNITY)
Admission: EM | Admit: 2023-07-03 | Discharge: 2023-07-03 | Disposition: A | Discharge status: Home / Self Care | Attending: Family Medicine | Admitting: Family Medicine

## 2023-07-03 DIAGNOSIS — R609 Edema, unspecified: Secondary | ICD-10-CM

## 2023-07-03 DIAGNOSIS — Z3A01 Less than 8 weeks gestation of pregnancy: Secondary | ICD-10-CM

## 2023-07-03 NOTE — ED Triage Notes (Signed)
 Patient presenting with pitting edam in the bilateral lower legs with some arm tightness as well onset 2 weeks ago. States this did happen before last year wen it got hot outside. 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 The swelling had stopped but then came back 2 weeks later. States when this happened last year they told her it could be from sitting too long.  Prescriptions or OTC medications tried: Yes- compression socks (makes it worse).     Patient currently pregnant and scheduled to see OBGYN Monday.

## 2023-07-03 NOTE — Discharge Instructions (Signed)
 You were seen today for lower extremity swelling.  As you are pregnant, we will not give you any medication for this.  I have given you a note for work, in order to stay home and elevate your legs.  Continue to avoid salt.  I do recommend you find compression socks that you are able to wear.  Please keep your appointment with your ob/gyn next week.

## 2023-07-03 NOTE — ED Provider Notes (Signed)
 [facility]    CSN: [id number] Arrival date & time: 07/03/23  1133      History   Chief Complaint Chief Complaint  Patient presents with   Edema    HPI Brandi Lucero is a 38 y.o. female.   Patient is here for bilateral leg swelling and arm tightness for several weeks.  No sob or difficulty breathing.  She does have allergies.  This has happened in the past when the weather got hot outside.  This time is worse overall.  She is a bus driver, and is driving more hours, sitting all day.  She is currently [redacted] weeks pregnant.   She does have an ob/gyn.  She  is aware of the issues, but recommended she discuss with her next week Monday.  She is drinking carbonated water (regular water she vomits) as much as she can.   She is here primarily b/c she is supposed to work today, and would like a note for work.        Past Medical History:  Diagnosis Date   Anxiety    Bipolar 1 disorder (HCC)    Depression    Ectopic pregnancy    IBS (irritable bowel syndrome) 2009    Patient Active Problem List   Diagnosis Date Noted   Allergic rhinitis 03/01/2014   Bipolar 2 disorder (HCC) 02/10/2014   Anxiety 02/10/2014    Past Surgical History:  Procedure Laterality Date   DILATION AND EVACUATION  03/03/2012   ECTOPIC PREGNANCY SURGERY  03/03/2004   LAPAROSCOPY FOR ECTOPIC PREGNANCY  03/03/2004   WISDOM TOOTH EXTRACTION      OB History     Gravida  4   Para      Term      Preterm      AB  2   Living         SAB  1   IAB      Ectopic  1   Multiple      Live Births               Home Medications    Prior to Admission medications   Medication Sig Start Date End Date Taking? Authorizing Provider  albuterol  (PROVENTIL ) (2.5 MG/3ML) 0.083% nebulizer solution Take 3 mLs (2.5 mg total) by nebulization every 6 (six) hours as needed for wheezing or shortness of breath. 01/02/23  Yes Janalee Mcmurray, PA-C  Prenatal Vit-Fe Fumarate-FA (PRENATAL PO)  Take by mouth.   Yes [provider]  progesterone (PROMETRIUM) 200 MG capsule Take 200 mg by mouth at bedtime. 06/26/23  Yes [provider]    Family History Family History  Problem Relation Age of Onset   Colon cancer Father    High blood pressure Mother     Social History Social History   Tobacco Use   Smoking status: Former    Current packs/day: 0.50    Types: Cigarettes   Smokeless tobacco: Never  Substance Use Topics   Alcohol use: Not Currently    Alcohol/week: 2.0 standard drinks of alcohol    Types: 2 Standard drinks or equivalent per week   Drug use: No     Allergies   Latex, Vicodin [hydrocodone -acetaminophen ], and Penicillins   Review of Systems Review of Systems  Constitutional: Negative.   HENT: Negative.    Respiratory: Negative.    Cardiovascular:  Positive for leg swelling.  Gastrointestinal: Negative.   Musculoskeletal: Negative.      Physical Exam  Triage Vital Signs ED Triage Vitals  Encounter Vitals Group     BP 07/03/23 1152 113/79     Systolic BP Percentile --      Diastolic BP Percentile --      Pulse Rate 07/03/23 1152 83     Resp 07/03/23 1152 18     Temp 07/03/23 1152 98 F (36.7 C)     Temp Source 07/03/23 1152 Oral     SpO2 07/03/23 1152 98 %     Weight --      Height --      Head Circumference --      Peak Flow --      Pain Score 07/03/23 1149 6     Pain Loc --      Pain Education --      Exclude from Growth Chart --    No data found.  Updated Vital Signs BP 113/79 (BP Location: Left Arm)   Pulse 83   Temp 98 F (36.7 C) (Oral)   Resp 18   LMP 05/12/2023 (Approximate)   SpO2 98%   Visual Acuity Right Eye Distance:   Left Eye Distance:   Bilateral Distance:    Right Eye Near:   Left Eye Near:    Bilateral Near:     Physical Exam Constitutional:      General: She is not in acute distress.    Appearance: Normal appearance. She is normal weight. She is not ill-appearing or  toxic-appearing.  Cardiovascular:     Rate and Rhythm: Normal rate and regular rhythm.  Pulmonary:     Effort: Pulmonary effort is normal.     Breath sounds: Normal breath sounds. No wheezing, rhonchi or rales.  Musculoskeletal:     Cervical back: Normal range of motion and neck supple. No tenderness.     Right lower leg: 1+ Edema present.     Left lower leg: 1+ Edema present.  Lymphadenopathy:     Cervical: No cervical adenopathy.  Skin:    General: Skin is warm.  Neurological:     General: No focal deficit present.     Mental Status: She is alert.  Psychiatric:        Mood and Affect: Mood normal.      UC Treatments / Results  Labs (all labs ordered are listed, but only abnormal results are displayed) Labs Reviewed - No data to display  EKG   Radiology No results found.  Procedures Procedures (including critical care time)  Medications Ordered in UC Medications - No data to display  Initial Impression / Assessment and Plan / UC Course  I have reviewed the triage vital signs and the nursing notes.  Pertinent labs & imaging results that were available during my care of the patient were reviewed by me and considered in my medical decision making (see chart for details).    Final Clinical Impressions(s) / UC Diagnoses   Final diagnoses:  Edema, unspecified type  Less than [redacted] weeks gestation of pregnancy     Discharge Instructions      You were seen today for lower extremity swelling.  As you are pregnant, we will not give you any medication for this.  I have given you a note for work, in order to stay home and elevate your legs.  Continue to avoid salt.  I do recommend you find compression socks that you are able to wear.  Please keep your appointment with your ob/gyn next week.  ED Prescriptions   None    PDMP not reviewed this encounter.   Lesle Ras, MD 07/03/23 1215

## 2023-07-06 LAB — OB RESULTS CONSOLE GC/CHLAMYDIA: Chlamydia: NEGATIVE

## 2023-07-06 LAB — HM PAP SMEAR: HM Pap smear: NEGATIVE

## 2023-07-22 ENCOUNTER — Inpatient Hospital Stay (HOSPITAL_COMMUNITY)
Admission: AD | Admit: 2023-07-22 | Discharge: 2023-07-23 | Disposition: A | Discharge status: Home / Self Care | Attending: Obstetrics and Gynecology | Admitting: Obstetrics and Gynecology

## 2023-07-22 ENCOUNTER — Encounter (HOSPITAL_COMMUNITY): Payer: Self-pay | Admitting: Family Medicine

## 2023-07-22 DIAGNOSIS — R1110 Vomiting, unspecified: Secondary | ICD-10-CM | POA: Diagnosis present

## 2023-07-22 DIAGNOSIS — Z3A10 10 weeks gestation of pregnancy: Secondary | ICD-10-CM | POA: Diagnosis not present

## 2023-07-22 DIAGNOSIS — R519 Headache, unspecified: Secondary | ICD-10-CM | POA: Insufficient documentation

## 2023-07-22 DIAGNOSIS — O210 Mild hyperemesis gravidarum: Secondary | ICD-10-CM | POA: Diagnosis not present

## 2023-07-22 LAB — URINALYSIS, ROUTINE W REFLEX MICROSCOPIC
Bilirubin Urine: NEGATIVE
Glucose, UA: NEGATIVE mg/dL
Hgb urine dipstick: NEGATIVE
Ketones, ur: 80 mg/dL — AB
Nitrite: NEGATIVE
Protein, ur: 30 mg/dL — AB
Specific Gravity, Urine: 1.024 (ref 1.005–1.030)
Squamous Epithelial / HPF: >50 /HPF (ref 0–5)
pH: 5 (ref 5.0–8.0)

## 2023-07-22 MED ORDER — ACETAMINOPHEN-CAFFEINE 500-65 MG PO TABS: 2.0000 | ORAL_TABLET | Freq: Once | ORAL | Status: DC

## 2023-07-22 NOTE — MAU Note (Signed)
..  Brandi Lucero is a 38 y.o. at [redacted]w[redacted]d here in MAU reporting: headache for the last few days, has not taken anything for headache.  Since yesterday can not keep anything down, liquids or solids.   Pain score: 6/10 Vitals:   07/22/23 2124  BP: 132/87  Pulse: 94  Resp: 19  Temp: 98.1 F (36.7 C)  SpO2: 100%     FHT:171 Lab orders placed from triage:  UA

## 2023-07-23 ENCOUNTER — Encounter (HOSPITAL_COMMUNITY): Payer: Self-pay

## 2023-07-23 DIAGNOSIS — Z3A10 10 weeks gestation of pregnancy: Secondary | ICD-10-CM

## 2023-07-23 DIAGNOSIS — O210 Mild hyperemesis gravidarum: Secondary | ICD-10-CM

## 2023-07-23 MED ORDER — PROMETHAZINE HCL 25 MG/ML IJ SOLN
25.0000 mg | Freq: Once | INTRAMUSCULAR | Status: AC
  Administered 2023-07-23: 25 mg via INTRAMUSCULAR
  Filled 2023-07-23: qty 1

## 2023-07-23 MED ORDER — SCOPOLAMINE 1 MG/3DAYS TD PT72: 1.0000 | MEDICATED_PATCH | TRANSDERMAL | 12 refills | Status: DC

## 2023-07-23 MED ORDER — ONDANSETRON 4 MG PO TBDP: 4.0000 mg | ORAL_TABLET | Freq: Three times a day (TID) | ORAL | 0 refills | Status: DC | PRN

## 2023-07-23 MED ORDER — ONDANSETRON 4 MG PO TBDP
8.0000 mg | ORAL_TABLET | Freq: Once | ORAL | Status: AC
  Administered 2023-07-23: 8 mg via ORAL
  Filled 2023-07-23: qty 2

## 2023-07-23 MED ORDER — SCOPOLAMINE 1 MG/3DAYS TD PT72
1.0000 | MEDICATED_PATCH | Freq: Once | TRANSDERMAL | Status: DC
  Administered 2023-07-23: 1.5 mg via TRANSDERMAL
  Filled 2023-07-23: qty 1

## 2023-07-23 NOTE — MAU Provider Note (Signed)
 Migraine, Poor PO intake     S [name] is a 38 y.o. [id number] pregnant female at [redacted]w[redacted]d who presents to MAU today with complaint of chronic HA and poor PO intake. Mom says has been has struggled with N/V since [redacted]wks gestation and the N/V were bad before developing the HA today. FHT confirmed in Triage.  Denies VB, pelvic pain.    Pertinent items noted in HPI and remainder of comprehensive ROS otherwise negative.   O BP 118/79   Pulse 87   Temp 98.1 F (36.7 C) (Oral)   Resp 19   Ht 5' (1.524 m)   Wt 79.1 kg   LMP 05/12/2023 (Approximate)   SpO2 100%   BMI 34.06 kg/m  Physical Exam Vitals and nursing note reviewed.  Constitutional:      General: She is not in acute distress.    Appearance: She is well-developed. She is not ill-appearing.  HENT:     Head: Normocephalic and atraumatic.     Mouth/Throat:     Mouth: Mucous membranes are moist.     Pharynx: Oropharynx is clear.  Eyes:     Extraocular Movements: Extraocular movements intact.  Cardiovascular:     Rate and Rhythm: Normal rate.  Pulmonary:     Effort: Pulmonary effort is normal. No respiratory distress.  Abdominal:     General: There is no distension.     Palpations: Abdomen is soft.     Tenderness: There is no abdominal tenderness.  Musculoskeletal:        General: No swelling. Normal range of motion.  Skin:    General: Skin is warm and dry.  Neurological:     Mental Status: She is alert and oriented to person, place, and time.  Psychiatric:        Mood and Affect: Mood normal.        Speech: Speech normal.        Behavior: Behavior normal.      MDM: MAU Course:  Will give Zofran  8mg  ODT then Excedrin.  However pt declined Excedrin given she believes HA from not having food.  Patient threw up shortly after having crackers.  Ordered IM phenergan  and Scop Patch. Pt able to tolerate ginger ale and sleeping with female partner in bed upon reassessment.  Stable for d/c.   AP #[redacted] weeks  gestation #Hyperemesis of pregnancy  - sent with Zofran  PRN, Scop Patches   Discharge from MAU in stable condition with strict/usual precautions Follow up at [facility] as scheduled for ongoing prenatal care  Allergies as of 07/23/2023       Reactions   Latex Other (See Comments), Itching   Vicodin [hydrocodone -acetaminophen ]    Bad headache    Penicillins Rash        Medication List     TAKE these medications    albuterol  (2.5 MG/3ML) 0.083% nebulizer solution Commonly known as: PROVENTIL  Take 3 mLs (2.5 mg total) by nebulization every 6 (six) hours as needed for wheezing or shortness of breath.   ondansetron  4 MG disintegrating tablet Commonly known as: ZOFRAN -ODT Take 1 tablet (4 mg total) by mouth every 8 (eight) hours as needed for nausea or vomiting.   PRENATAL PO Take by mouth.   progesterone 200 MG capsule Commonly known as: PROMETRIUM Take 200 mg by mouth at bedtime.        Ebony Goldstein, MD 07/23/2023 1:08 AM

## 2023-07-26 ENCOUNTER — Inpatient Hospital Stay (HOSPITAL_COMMUNITY)

## 2023-07-26 ENCOUNTER — Inpatient Hospital Stay (HOSPITAL_COMMUNITY)
Admission: AD | Admit: 2023-07-26 | Discharge: 2023-07-26 | Disposition: A | Discharge status: Home / Self Care | Payer: Self-pay | Attending: Obstetrics and Gynecology | Admitting: Obstetrics and Gynecology

## 2023-07-26 DIAGNOSIS — O26851 Spotting complicating pregnancy, first trimester: Secondary | ICD-10-CM | POA: Diagnosis not present

## 2023-07-26 DIAGNOSIS — O23591 Infection of other part of genital tract in pregnancy, first trimester: Secondary | ICD-10-CM | POA: Diagnosis not present

## 2023-07-26 DIAGNOSIS — O09521 Supervision of elderly multigravida, first trimester: Secondary | ICD-10-CM | POA: Insufficient documentation

## 2023-07-26 DIAGNOSIS — Z3A10 10 weeks gestation of pregnancy: Secondary | ICD-10-CM

## 2023-07-26 DIAGNOSIS — B9689 Other specified bacterial agents as the cause of diseases classified elsewhere: Secondary | ICD-10-CM | POA: Diagnosis not present

## 2023-07-26 DIAGNOSIS — O3411 Maternal care for benign tumor of corpus uteri, first trimester: Secondary | ICD-10-CM | POA: Insufficient documentation

## 2023-07-26 DIAGNOSIS — D259 Leiomyoma of uterus, unspecified: Secondary | ICD-10-CM | POA: Insufficient documentation

## 2023-07-26 DIAGNOSIS — Z6791 Unspecified blood type, Rh negative: Secondary | ICD-10-CM | POA: Insufficient documentation

## 2023-07-26 LAB — COMPREHENSIVE METABOLIC PANEL WITH GFR
ALT: 36 U/L (ref 0–44)
AST: 31 U/L (ref 15–41)
Albumin: 3.3 g/dL — ABNORMAL LOW (ref 3.5–5.0)
Alkaline Phosphatase: 72 U/L (ref 38–126)
Anion gap: 7 (ref 5–15)
BUN: 7 mg/dL (ref 6–20)
CO2: 20 mmol/L — ABNORMAL LOW (ref 22–32)
Calcium: 8.7 mg/dL — ABNORMAL LOW (ref 8.9–10.3)
Chloride: 104 mmol/L (ref 98–111)
Creatinine, Ser: 0.6 mg/dL (ref 0.44–1.00)
GFR, Estimated: >60 mL/min (ref >60)
Glucose, Bld: 92 mg/dL (ref 70–99)
Potassium: 3.3 mmol/L — ABNORMAL LOW (ref 3.5–5.1)
Sodium: 131 mmol/L — ABNORMAL LOW (ref 135–145)
Total Bilirubin: 0.2 mg/dL (ref 0.0–1.2)
Total Protein: 7.2 g/dL (ref 6.5–8.1)

## 2023-07-26 LAB — CBC
HCT: 35.6 % — ABNORMAL LOW (ref 36.0–46.0)
Hemoglobin: 11.5 g/dL — ABNORMAL LOW (ref 12.0–15.0)
MCH: 26.6 pg (ref 26.0–34.0)
MCHC: 32.3 g/dL (ref 30.0–36.0)
MCV: 82.4 fL (ref 80.0–100.0)
Platelets: 307 10*3/uL (ref 150–400)
RBC: 4.32 MIL/uL (ref 3.87–5.11)
RDW: 14.6 % (ref 11.5–15.5)
WBC: 4.7 10*3/uL (ref 4.0–10.5)
nRBC: 0 % (ref 0.0–0.2)

## 2023-07-26 LAB — URINALYSIS, ROUTINE W REFLEX MICROSCOPIC
Bilirubin Urine: NEGATIVE
Glucose, UA: NEGATIVE mg/dL
Ketones, ur: 20 mg/dL — AB
Leukocytes,Ua: NEGATIVE
Nitrite: NEGATIVE
Protein, ur: 30 mg/dL — AB
Specific Gravity, Urine: 1.009 (ref 1.005–1.030)
pH: 6 (ref 5.0–8.0)

## 2023-07-26 LAB — HCG, QUANTITATIVE, PREGNANCY: hCG, Beta Chain, Quant, S: 179689 m[IU]/mL — ABNORMAL HIGH (ref <5)

## 2023-07-26 LAB — WET PREP, GENITAL
Sperm: NONE SEEN
Trich, Wet Prep: NONE SEEN
WBC, Wet Prep HPF POC: <10 (ref <10)
Yeast Wet Prep HPF POC: NONE SEEN

## 2023-07-26 MED ORDER — METRONIDAZOLE 500 MG PO TABS: 500.0000 mg | ORAL_TABLET | Freq: Two times a day (BID) | ORAL | 0 refills | Status: DC

## 2023-07-26 MED ORDER — RHO D IMMUNE GLOBULIN 1500 UNIT/2ML IJ SOSY
300.0000 ug | PREFILLED_SYRINGE | Freq: Once | INTRAMUSCULAR | Status: AC
  Administered 2023-07-26: 300 ug via INTRAMUSCULAR
  Filled 2023-07-26: qty 2

## 2023-07-26 NOTE — MAU Provider Note (Signed)
 History     CSN: [id number]  Arrival date and time: 07/26/23 0006   Event Date/Time   First Provider Initiated Contact with Patient 07/26/23 0225      Chief Complaint  Patient presents with   Vaginal Bleeding    BALJIT LIEBERT is a 38 y.o. G4P0030 at [redacted]w[redacted]d who receives care at [facility].  Patient reports next appt is August 03, 2023.  She presents today for vaginal bleeding.  She states she noted bleeding with wiping around 2300.  She states upon arrival she noted blood on the pad.  She reports it was "burgundy" in color. She denies cramping or discharge prior to the bleeding. Patient expresses concern with bleeding and history of miscarriage.  OB History     Gravida  4   Para      Term      Preterm      AB  2   Living         SAB  1   IAB      Ectopic  1   Multiple      Live Births              Past Medical History:  Diagnosis Date   Anxiety    Bipolar 1 disorder (HCC)    Depression    Ectopic pregnancy    IBS (irritable bowel syndrome) 2009    Past Surgical History:  Procedure Laterality Date   DILATION AND EVACUATION  03/03/2012   ECTOPIC PREGNANCY SURGERY  03/03/2004   LAPAROSCOPY FOR ECTOPIC PREGNANCY  03/03/2004   WISDOM TOOTH EXTRACTION      Family History  Problem Relation Age of Onset   Colon cancer Father    High blood pressure Mother     Social History   Tobacco Use   Smoking status: Former    Current packs/day: 0.50    Types: Cigarettes   Smokeless tobacco: Never  Substance Use Topics   Alcohol use: Not Currently    Alcohol/week: 2.0 standard drinks of alcohol    Types: 2 Standard drinks or equivalent per week   Drug use: No    Allergies:  Allergies  Allergen Reactions   Latex Other (See Comments) and Itching   Vicodin [Hydrocodone -Acetaminophen ]     Bad headache    Penicillins Rash    Medications Prior to Admission  Medication Sig Dispense Refill Last Dose/Taking   albuterol  (PROVENTIL ) (2.5 MG/3ML) 0.083%  nebulizer solution Take 3 mLs (2.5 mg total) by nebulization every 6 (six) hours as needed for wheezing or shortness of breath. 75 mL 12    ondansetron  (ZOFRAN -ODT) 4 MG disintegrating tablet Take 1 tablet (4 mg total) by mouth every 8 (eight) hours as needed for nausea or vomiting. 15 tablet 0    Prenatal Vit-Fe Fumarate-FA (PRENATAL PO) Take by mouth.      progesterone (PROMETRIUM) 200 MG capsule Take 200 mg by mouth at bedtime.      scopolamine (TRANSDERM-SCOP) 1 MG/3DAYS Place 1 patch (1.5 mg total) onto the skin every 3 (three) days. 10 patch 12     Review of Systems  Gastrointestinal:  Negative for abdominal pain, constipation, diarrhea, nausea and vomiting.  Genitourinary:  Positive for vaginal bleeding. Negative for difficulty urinating, dysuria and vaginal discharge.   Physical Exam   Blood pressure 118/83, pulse 91, temperature 98.3 F (36.8 C), temperature source Oral, resp. rate 16, height 5' (1.524 m), weight 79.4 kg, last menstrual period 05/12/2023, SpO2 100%.  Physical  Exam Vitals and nursing note reviewed.  Constitutional:      Appearance: Normal appearance.  HENT:     Head: Normocephalic and atraumatic.  Eyes:     Conjunctiva/sclera: Conjunctivae normal.  Cardiovascular:     Rate and Rhythm: Normal rate.  Pulmonary:     Effort: Pulmonary effort is normal. No respiratory distress.  Musculoskeletal:        General: Normal range of motion.     Cervical back: Normal range of motion.  Skin:    General: Skin is warm and dry.  Neurological:     Mental Status: She is alert and oriented to person, place, and time.  Psychiatric:        Mood and Affect: Mood normal.        Behavior: Behavior normal.     MAU Course  Procedures Results for orders placed or performed during the hospital encounter of 07/26/23 (from the past 24 hours)  Urinalysis, Routine w reflex microscopic -Urine, Clean Catch     Status: Abnormal   Collection Time: 07/26/23 12:22 AM  Result Value  Ref Range   Color, Urine YELLOW YELLOW   APPearance CLOUDY (A) CLEAR   Specific Gravity, Urine 1.009 1.005 - 1.030   pH 6.0 5.0 - 8.0   Glucose, UA NEGATIVE NEGATIVE mg/dL   Hgb urine dipstick LARGE (A) NEGATIVE   Bilirubin Urine NEGATIVE NEGATIVE   Ketones, ur 20 (A) NEGATIVE mg/dL   Protein, ur 30 (A) NEGATIVE mg/dL   Nitrite NEGATIVE NEGATIVE   Leukocytes,Ua NEGATIVE NEGATIVE   RBC / HPF 0-5 0 - 5 RBC/hpf   WBC, UA 0-5 0 - 5 WBC/hpf   Bacteria, UA FEW (A) NONE SEEN   Squamous Epithelial / HPF 11-20 0 - 5 /HPF   Mucus PRESENT   Wet prep, genital     Status: Abnormal   Collection Time: 07/26/23 12:22 AM  Result Value Ref Range   Yeast Wet Prep HPF POC NONE SEEN NONE SEEN   Trich, Wet Prep NONE SEEN NONE SEEN   Clue Cells Wet Prep HPF POC PRESENT (A) NONE SEEN   WBC, Wet Prep HPF POC <10 <10   Sperm NONE SEEN   hCG, quantitative, pregnancy     Status: Abnormal   Collection Time: 07/26/23 12:42 AM  Result Value Ref Range   hCG, Beta Chain, Quant, S 179,689 (H) <5 mIU/mL  CBC     Status: Abnormal   Collection Time: 07/26/23 12:42 AM  Result Value Ref Range   WBC 4.7 4.0 - 10.5 K/uL   RBC 4.32 3.87 - 5.11 MIL/uL   Hemoglobin 11.5 (L) 12.0 - 15.0 g/dL   HCT 14.7 (L) 82.9 - 56.2 %   MCV 82.4 80.0 - 100.0 fL   MCH 26.6 26.0 - 34.0 pg   MCHC 32.3 30.0 - 36.0 g/dL   RDW 13.0 86.5 - 78.4 %   Platelets 307 150 - 400 K/uL   nRBC 0.0 0.0 - 0.2 %  Comprehensive metabolic panel     Status: Abnormal   Collection Time: 07/26/23 12:42 AM  Result Value Ref Range   Sodium 131 (L) 135 - 145 mmol/L   Potassium 3.3 (L) 3.5 - 5.1 mmol/L   Chloride 104 98 - 111 mmol/L   CO2 20 (L) 22 - 32 mmol/L   Glucose, Bld 92 70 - 99 mg/dL   BUN 7 6 - 20 mg/dL   Creatinine, Ser 6.96 0.44 - 1.00 mg/dL   Calcium 8.7 (L)  8.9 - 10.3 mg/dL   Total Protein 7.2 6.5 - 8.1 g/dL   Albumin 3.3 (L) 3.5 - 5.0 g/dL   AST 31 15 - 41 U/L   ALT 36 0 - 44 U/L   Alkaline Phosphatase 72 38 - 126 U/L   Total  Bilirubin 0.2 0.0 - 1.2 mg/dL   GFR, Estimated >60 >45 mL/min   Anion gap 7 5 - 15   US  OB Comp Less 14 Wks Result Date: 07/26/2023 CLINICAL DATA:  Spotting, small clot. EXAM: OBSTETRIC <14 WK US  AND TRANSVAGINAL OB US  TECHNIQUE: Both transabdominal and transvaginal ultrasound examinations were performed for complete evaluation of the gestation as well as the maternal uterus, adnexal regions, and pelvic cul-de-sac. Transvaginal technique was performed to assess early pregnancy. COMPARISON:  None Available. FINDINGS: Intrauterine gestational sac: Single Yolk sac:  Visualized. Embryo:  Visualized. Cardiac Activity: Visualized. Heart Rate: 159 bpm CRL: 43.2 mm 11 w 1 d US  EDC: February 13, 2024 Subchorionic hemorrhage:  None visualized. Maternal uterus/adnexae: Multiple large heterogeneous uterine fibroids are seen. The largest measures approximately 11.3 cm x 9.5 cm x 9.0 cm and is seen within the uterine parenchyma on the left. 6.0 cm x 7.7 cm x 5.5 cm and 9.6 cm x 8.6 cm x 8.9 cm uterine fibroids are also noted. The right ovary is not visualized. The left ovary measures 2.5 cm x 2.2 cm x 1.8 cm and is normal in appearance. No pelvic free fluid is seen. IMPRESSION: 1. Single, viable intrauterine pregnancy at approximately 11 weeks and 1 day gestation by ultrasound evaluation. 2. Multiple large heterogeneous uterine fibroids. Electronically Signed   By: Virgle Grime M.D.   On: 07/26/2023 02:15    MDM Physical Exam Cultures: Wet Prep and GC/CT Labs: UA, CBC, CMP, hCG, ABO Ultrasound Rhogam Prescription Assessment and Plan  38 year old, G4P0030  SIUP at 10.5 weeks Vaginal Spotting RH Negative  -Labs and US  ordered while patient in triage. -Results as above.  -Reassured that IUP noted and c/w dates. No SCH or other obvious placental issues. -Patient aware of multiple large fibroids.    -Informed of clue cells suggestive of BV and treatment recommended.  -Patient agreeable. -Discussed Rh  negative blood type and rhogam dosing. Patient agreeable. -Work up ordered. -Will give and discharge.    Wynell Heath Thatiana Renbarger 07/26/2023, 2:25 AM   Reassessment (3:56 AM) -Rhogam injection given. -Rx sent to pharmacy on file.  -Bleeding and record precautions reviewed. -Encouraged to call primary office or return to MAU if symptoms worsen or with the onset of new symptoms. -Discharged to home in stable condition.  Kraig Peru  MSN, CNM Advanced Practice Provider, [facility] [employer]

## 2023-07-26 NOTE — MAU Note (Signed)
 MAU Triage Note:  .Brandi Lucero is a 38 y.o. at [redacted]w[redacted]d here in MAU reporting: started noticing spotting when she went to the bathroom about an hour ago. Also noticed a small clot. Denies pain or abnormal discharge. Denies recent IC.   Patient complaint: spotting, passed a clot  Pain Score: 0-No pain     Onset of complaint: today LMP: Patient's last menstrual period was 05/12/2023 (approximate).  Vitals:   07/26/23 0017  BP: 118/83  Pulse: 91  Resp: 16  Temp: 98.3 F (36.8 C)  SpO2: 100%    FHT:  Fetal Heart Rate Mode: Doppler Baseline Rate (A): 166 bpm Lab orders placed from triage: UA

## 2023-07-27 LAB — RH IG WORKUP (INCLUDES ABO/RH)
ABO/RH(D): O NEG
Antibody Screen: NEGATIVE
Gestational Age(Wks): 10.5
Unit division: 0

## 2023-07-28 LAB — GC/CHLAMYDIA PROBE AMP (~~LOC~~) NOT AT ARMC
Chlamydia: NEGATIVE
Comment: NEGATIVE
Comment: NORMAL
Neisseria Gonorrhea: NEGATIVE

## 2023-08-27 ENCOUNTER — Encounter: Payer: Self-pay | Admitting: Emergency Medicine

## 2023-08-27 ENCOUNTER — Emergency Department
Admission: EM | Admit: 2023-08-27 | Discharge: 2023-08-27 | Disposition: A | Discharge status: Home / Self Care | Attending: Emergency Medicine | Admitting: Emergency Medicine

## 2023-08-27 DIAGNOSIS — E876 Hypokalemia: Secondary | ICD-10-CM | POA: Diagnosis not present

## 2023-08-27 DIAGNOSIS — O26892 Other specified pregnancy related conditions, second trimester: Secondary | ICD-10-CM | POA: Insufficient documentation

## 2023-08-27 DIAGNOSIS — R1032 Left lower quadrant pain: Secondary | ICD-10-CM | POA: Insufficient documentation

## 2023-08-27 DIAGNOSIS — Z3A16 16 weeks gestation of pregnancy: Secondary | ICD-10-CM | POA: Diagnosis not present

## 2023-08-27 LAB — URINALYSIS, ROUTINE W REFLEX MICROSCOPIC
Bilirubin Urine: NEGATIVE
Glucose, UA: NEGATIVE mg/dL
Hgb urine dipstick: NEGATIVE
Ketones, ur: 5 mg/dL — AB
Leukocytes,Ua: NEGATIVE
Nitrite: NEGATIVE
Protein, ur: 30 mg/dL — AB
Specific Gravity, Urine: 1.026 (ref 1.005–1.030)
pH: 6 (ref 5.0–8.0)

## 2023-08-27 LAB — CBC
HCT: 32.1 % — ABNORMAL LOW (ref 36.0–46.0)
Hemoglobin: 10.3 g/dL — ABNORMAL LOW (ref 12.0–15.0)
MCH: 26.3 pg (ref 26.0–34.0)
MCHC: 32.1 g/dL (ref 30.0–36.0)
MCV: 82.1 fL (ref 80.0–100.0)
Platelets: 307 10*3/uL (ref 150–400)
RBC: 3.91 MIL/uL (ref 3.87–5.11)
RDW: 14.1 % (ref 11.5–15.5)
WBC: 4.4 10*3/uL (ref 4.0–10.5)
nRBC: 0 % (ref 0.0–0.2)

## 2023-08-27 LAB — COMPREHENSIVE METABOLIC PANEL WITH GFR
ALT: 19 U/L (ref 0–44)
AST: 24 U/L (ref 15–41)
Albumin: 3 g/dL — ABNORMAL LOW (ref 3.5–5.0)
Alkaline Phosphatase: 73 U/L (ref 38–126)
Anion gap: 12 (ref 5–15)
BUN: 8 mg/dL (ref 6–20)
CO2: 19 mmol/L — ABNORMAL LOW (ref 22–32)
Calcium: 8.7 mg/dL — ABNORMAL LOW (ref 8.9–10.3)
Chloride: 105 mmol/L (ref 98–111)
Creatinine, Ser: 0.46 mg/dL (ref 0.44–1.00)
GFR, Estimated: >60 mL/min (ref >60)
Glucose, Bld: 102 mg/dL — ABNORMAL HIGH (ref 70–99)
Potassium: 3.4 mmol/L — ABNORMAL LOW (ref 3.5–5.1)
Sodium: 136 mmol/L (ref 135–145)
Total Bilirubin: 0.5 mg/dL (ref 0.0–1.2)
Total Protein: 7.3 g/dL (ref 6.5–8.1)

## 2023-08-27 LAB — LIPASE, BLOOD: Lipase: 30 U/L (ref 11–51)

## 2023-08-27 NOTE — ED Triage Notes (Signed)
 Pt reports left sided abd pain fro a few days. Today, pt began having lower abd pressure. Pt has appt with OBGYN on 6/30. Pt 16 weeks Saturday. Hx of miscarriage.

## 2023-08-27 NOTE — ED Provider Notes (Signed)
-----------------------------------------   8:01 PM on 08/27/2023 -----------------------------------------  Blood pressure 115/79, pulse 85, temperature 98.7 F (37.1 C), temperature source Oral, resp. rate 18, height 5' (1.524 m), weight 77.6 kg, last menstrual period 05/12/2023, SpO2 99%.  Assuming care from Dr. Dorothyann.  In short, Brandi Lucero is a 38 y.o. female with a chief complaint of Abdominal Pain .  Refer to the original H&P for additional details.  The current plan of care is to wait for UA results, treat for UTI with macrobid  if present and then discharge.  ____________________________________________    ED Results / Procedures / Treatments   Labs (all labs ordered are listed, but only abnormal results are displayed) Labs Reviewed  COMPREHENSIVE METABOLIC PANEL WITH GFR - Abnormal; Notable for the following components:      Result Value   Potassium 3.4 (*)    CO2 19 (*)    Glucose, Bld 102 (*)    Calcium 8.7 (*)    Albumin 3.0 (*)    All other components within normal limits  CBC - Abnormal; Notable for the following components:   Hemoglobin 10.3 (*)    HCT 32.1 (*)    All other components within normal limits  URINALYSIS, ROUTINE W REFLEX MICROSCOPIC - Abnormal; Notable for the following components:   Color, Urine AMBER (*)    APPearance CLOUDY (*)    Ketones, ur 5 (*)    Protein, ur 30 (*)    Bacteria, UA MANY (*)    All other components within normal limits  URINE CULTURE  LIPASE, BLOOD    No results found.   PROCEDURES:  Critical Care performed: No  Procedures   MEDICATIONS ORDERED IN ED: Medications - No data to display   IMPRESSION / MDM / ASSESSMENT AND PLAN / ED COURSE  I reviewed the triage vital signs and the nursing notes.                             38 year old female presents for evaluation of abdominal pain.   Differential diagnosis includes, but is not limited to, constipation, UTI  Patient's presentation is most  consistent with acute complicated illness / injury requiring diagnostic workup.  Urinalysis shows some ketones, protein and many bacteria and squamous cells without nitrites, leukocytes or WBCs.  Feel this is consistent with a dirty catch and not asymptomatic bacteriuria. Will send urine for culture, but holding off on antibiotics for now.  Patient's diagnosis is consistent with abdominal pain during pregnancy in second trimester. Patient is to follow up with PCP as needed or otherwise directed. Patient is given ED precautions to return to the ED for any worsening or new symptoms.     FINAL CLINICAL IMPRESSION(S) / ED DIAGNOSES   Final diagnoses:  Abdominal pain during pregnancy in second trimester     Rx / DC Orders   ED Discharge Orders     None        Note:  This document was prepared using Dragon voice recognition software and may include unintentional dictation errors.    Cleaster Tinnie LABOR, PA-C 08/27/23 2050    Floy Roberts, MD 08/27/23 [phone]

## 2023-08-27 NOTE — Discharge Instructions (Signed)
 Please follow-up with your OB/GYN as soon as you are able.  Please use MiraLAX as needed for constipation as written on the product.  Please drink plenty of fluids.  Return to the emergency department for any worsening abdominal pain any fever any vaginal bleeding or fluid leakage or any other symptom personally concerning to yourself.

## 2023-08-27 NOTE — ED Provider Notes (Signed)
 [facility] Provider Note    Event Date/Time   First MD Initiated Contact with Patient 08/27/23 1803     (approximate)  History   Chief Complaint: Abdominal Pain  HPI  Brandi Lucero is a 38 y.o. female with a past close of anxiety, bipolar, prior ectopic pregnancy who presents to the emergency department approximately [redacted] weeks pregnant with lower abdominal pain and cramping.  According to the patient for the past few days she has been experiencing intermittent cramping in the lower abdomen mostly in the left lower quadrant.  Patient is also been experiencing constipation.  She was concerned about the baby she could not get in with OB for another week or so so she came to the emergency department for evaluation.  Patient denies any fever denies any urinary symptoms.  No vaginal bleeding discharge or fluid leakage.  Physical Exam   Triage Vital Signs: ED Triage Vitals  Encounter Vitals Group     BP 08/27/23 1642 115/79     Girls Systolic BP Percentile --      Girls Diastolic BP Percentile --      Boys Systolic BP Percentile --      Boys Diastolic BP Percentile --      Pulse Rate 08/27/23 1642 85     Resp 08/27/23 1642 18     Temp 08/27/23 1642 98.7 F (37.1 C)     Temp Source 08/27/23 1642 Oral     SpO2 08/27/23 1642 99 %     Weight 08/27/23 1643 171 lb (77.6 kg)     Height 08/27/23 1643 5' (1.524 m)     Head Circumference --      Peak Flow --      Pain Score 08/27/23 1642 6     Pain Loc --      Pain Education --      Exclude from Growth Chart --     Most recent vital signs: Vitals:   08/27/23 1642  BP: 115/79  Pulse: 85  Resp: 18  Temp: 98.7 F (37.1 C)  SpO2: 99%    General: Awake, no distress.  CV:  Good peripheral perfusion.  Regular rate and rhythm  Resp:  Normal effort.  Equal breath sounds bilaterally.  Abd:  No distention.  Soft, largely benign abdomen. ED Results / Procedures / Treatments   MEDICATIONS ORDERED IN  ED: Medications - No data to display   IMPRESSION / MDM / ASSESSMENT AND PLAN / ED COURSE  I reviewed the triage vital signs and the nursing notes.  Patient's presentation is most consistent with acute presentation with potential threat to life or bodily function.  Patient presents to the emergency department for intermittent left lower quadrant abdominal pain.  Patient also states constipation which she believes could be the cause of the discomfort but wanted to make sure that the baby was okay and cannot get in with the OB until next week.  Patient's vital signs are reassuring.  Patient's physical exam is reassuring.  Patient has a benign abdomen.  Bedside ultrasound performed by myself shows intrauterine pregnancy with good fetal movement and a heart rate of 154 bpm.  Patient's CBC is normal chemistry is normal including normal LFTs and lipase.  Will check a urine sample as a precaution.  If the urinalysis shows no concerning findings anticipate discharge home with MiraLAX to be used as needed for constipation and have the patient follow-up with her OB.  Patient agreeable to plan  and is reassured by the ultrasound.  FINAL CLINICAL IMPRESSION(S) / ED DIAGNOSES   Left lower quadrant abdominal pain Constipation   Note:  This document was prepared using Dragon voice recognition software and may include unintentional dictation errors.   Dorothyann Drivers, MD 08/27/23 [phone]

## 2023-08-29 LAB — URINE CULTURE

## 2023-09-28 ENCOUNTER — Ambulatory Visit (HOSPITAL_COMMUNITY)
Admission: EM | Admit: 2023-09-28 | Discharge: 2023-09-28 | Disposition: A | Discharge status: Home / Self Care | Attending: Internal Medicine | Admitting: Internal Medicine

## 2023-09-28 ENCOUNTER — Encounter (HOSPITAL_COMMUNITY): Payer: Self-pay

## 2023-09-28 DIAGNOSIS — Z3A20 20 weeks gestation of pregnancy: Secondary | ICD-10-CM | POA: Diagnosis not present

## 2023-09-28 DIAGNOSIS — J029 Acute pharyngitis, unspecified: Secondary | ICD-10-CM | POA: Diagnosis not present

## 2023-09-28 DIAGNOSIS — O26812 Pregnancy related exhaustion and fatigue, second trimester: Secondary | ICD-10-CM | POA: Diagnosis not present

## 2023-09-28 LAB — POCT URINALYSIS DIP (MANUAL ENTRY)
Glucose, UA: NEGATIVE mg/dL
Leukocytes, UA: NEGATIVE
Nitrite, UA: NEGATIVE
Protein Ur, POC: 30 mg/dL — AB
Spec Grav, UA: 1.025 (ref 1.010–1.025)
Urobilinogen, UA: 4 U/dL — AB
pH, UA: 7 (ref 5.0–8.0)

## 2023-09-28 LAB — POC COVID19/FLU A&B COMBO
Covid Antigen, POC: NEGATIVE
Influenza A Antigen, POC: NEGATIVE
Influenza B Antigen, POC: NEGATIVE

## 2023-09-28 LAB — POCT RAPID STREP A (OFFICE): Rapid Strep A Screen: NEGATIVE

## 2023-09-28 NOTE — ED Triage Notes (Signed)
 Patient presents to office for sore throat, chills and body aches that started yesterday.  Patient is currently [redacted] weeks pregnant.

## 2023-09-28 NOTE — ED Provider Notes (Signed)
 [facility]    CSN: [id number] Arrival date & time: 09/28/23  1658      History   Chief Complaint Chief Complaint  Patient presents with   Sore Throat   Chills   Fatigue    HPI Brandi Lucero is a 38 y.o. female.   38 year old female who presents urgent care with complaints of sore throat and bodyaches as well as chills.  She has had fatigue as well.  She is currently [redacted] weeks pregnant but reports that the symptoms just started within the last 1 to 2 days.  She denies any fevers, nausea, vomiting.  She does have a little bit of increased pressure in the lower abdomen but not severe.  She has felt like she might be dehydrated.  She has not had any vaginal symptoms.  She has not really taken any medication for this as she did not want to take anything being pregnant.  She did want to make sure she did not have an infectious process that might need treatment with being pregnant.   Sore Throat Pertinent negatives include no chest pain, no abdominal pain and no shortness of breath.    Past Medical History:  Diagnosis Date   Anxiety    Bipolar 1 disorder (HCC)    Depression    Ectopic pregnancy    IBS (irritable bowel syndrome) 2009    Patient Active Problem List   Diagnosis Date Noted   Allergic rhinitis 03/01/2014   Bipolar 2 disorder (HCC) 02/10/2014   Anxiety 02/10/2014    Past Surgical History:  Procedure Laterality Date   DILATION AND EVACUATION  03/03/2012   ECTOPIC PREGNANCY SURGERY  03/03/2004   LAPAROSCOPY FOR ECTOPIC PREGNANCY  03/03/2004   WISDOM TOOTH EXTRACTION      OB History     Gravida  4   Para      Term      Preterm      AB  2   Living         SAB  1   IAB      Ectopic  1   Multiple      Live Births               Home Medications    Prior to Admission medications   Medication Sig Start Date End Date Taking? Authorizing Provider  albuterol  (PROVENTIL ) (2.5 MG/3ML) 0.083% nebulizer solution Take 3 mLs (2.5 mg  total) by nebulization every 6 (six) hours as needed for wheezing or shortness of breath. 01/02/23   Robinson, John K, PA-C  metroNIDAZOLE  (FLAGYL ) 500 MG tablet Take 1 tablet (500 mg total) by mouth 2 (two) times daily. 07/26/23   Synthia Raisin, CNM  ondansetron  (ZOFRAN -ODT) 4 MG disintegrating tablet Take 1 tablet (4 mg total) by mouth every 8 (eight) hours as needed for nausea or vomiting. 07/23/23   Jhonny Augustin JAYSON, MD  Prenatal Vit-Fe Fumarate-FA (PRENATAL PO) Take by mouth.    [provider]  progesterone (PROMETRIUM) 200 MG capsule Take 200 mg by mouth at bedtime. 06/26/23   [provider]  scopolamine  (TRANSDERM-SCOP) 1 MG/3DAYS Place 1 patch (1.5 mg total) onto the skin every 3 (three) days. 07/23/23   Jhonny Augustin JAYSON, MD    Family History Family History  Problem Relation Age of Onset   Colon cancer Father    High blood pressure Mother     Social History Social History   Tobacco Use   Smoking status: Former  Current packs/day: 0.50    Types: Cigarettes   Smokeless tobacco: Never  Substance Use Topics   Alcohol use: Not Currently    Alcohol/week: 2.0 standard drinks of alcohol    Types: 2 Standard drinks or equivalent per week   Drug use: No     Allergies   Latex, Vicodin [hydrocodone -acetaminophen ], and Penicillins   Review of Systems Review of Systems  Constitutional:  Positive for chills and fatigue. Negative for fever.  HENT:  Positive for sore throat. Negative for ear pain.   Eyes:  Negative for pain and visual disturbance.  Respiratory:  Negative for cough and shortness of breath.   Cardiovascular:  Negative for chest pain and palpitations.  Gastrointestinal:  Negative for abdominal pain and vomiting.  Genitourinary:  Negative for decreased urine volume, difficulty urinating, dysuria, hematuria, vaginal bleeding, vaginal discharge and vaginal pain.  Musculoskeletal:  Negative for arthralgias and back pain.  Skin:  Negative for color change and  rash.  Neurological:  Negative for seizures and syncope.  All other systems reviewed and are negative.    Physical Exam Triage Vital Signs ED Triage Vitals [09/28/23 1834]  Encounter Vitals Group     BP (!) 137/93     Girls Systolic BP Percentile      Girls Diastolic BP Percentile      Boys Systolic BP Percentile      Boys Diastolic BP Percentile      Pulse Rate 93     Resp 18     Temp 98.3 F (36.8 C)     Temp Source Oral     SpO2 94 %     Weight      Height      Head Circumference      Peak Flow      Pain Score      Pain Loc      Pain Education      Exclude from Growth Chart    No data found.  Updated Vital Signs BP (!) 137/93 (BP Location: Left Arm)   Pulse 93   Temp 98.3 F (36.8 C) (Oral)   Resp 18   LMP 05/12/2023 (Approximate)   SpO2 94%   Visual Acuity Right Eye Distance:   Left Eye Distance:   Bilateral Distance:    Right Eye Near:   Left Eye Near:    Bilateral Near:     Physical Exam Vitals and nursing note reviewed.  Constitutional:      General: She is not in acute distress.    Appearance: She is well-developed.  HENT:     Head: Normocephalic and atraumatic.     Right Ear: Tympanic membrane normal.     Left Ear: Tympanic membrane normal.     Nose: No congestion.     Mouth/Throat:     Mouth: Mucous membranes are moist.     Pharynx: Pharyngeal swelling, oropharyngeal exudate and posterior oropharyngeal erythema present.     Tonsils: 1+ on the right. 1+ on the left.  Eyes:     Conjunctiva/sclera: Conjunctivae normal.  Cardiovascular:     Rate and Rhythm: Normal rate and regular rhythm.     Heart sounds: No murmur heard. Pulmonary:     Effort: Pulmonary effort is normal. No respiratory distress.     Breath sounds: Normal breath sounds.  Abdominal:     Palpations: Abdomen is soft.     Tenderness: There is no abdominal tenderness.  Musculoskeletal:        General:  No swelling.     Cervical back: Neck supple.  Skin:    General: Skin  is warm and dry.     Capillary Refill: Capillary refill takes less than 2 seconds.  Neurological:     Mental Status: She is alert.  Psychiatric:        Mood and Affect: Mood normal.      UC Treatments / Results  Labs (all labs ordered are listed, but only abnormal results are displayed) Labs Reviewed  POCT URINALYSIS DIP (MANUAL ENTRY) - Abnormal; Notable for the following components:      Result Value   Color, UA orange (*)    Clarity, UA cloudy (*)    Bilirubin, UA small (*)    Ketones, POC UA large (80) (*)    Blood, UA trace-intact (*)    Protein Ur, POC =30 (*)    Urobilinogen, UA 4.0 (*)    All other components within normal limits  POCT RAPID STREP A (OFFICE)  POC COVID19/FLU A&B COMBO    EKG   Radiology No results found.  Procedures Procedures (including critical care time)  Medications Ordered in UC Medications - No data to display  Initial Impression / Assessment and Plan / UC Course  I have reviewed the triage vital signs and the nursing notes.  Pertinent labs & imaging results that were available during my care of the patient were reviewed by me and considered in my medical decision making (see chart for details).     Sore throat  Pregnancy related fatigue in second trimester   Strep testing done today was negative flu and COVID testing done today was negative.  Urinalysis does suggest some dehydration.  At this time symptoms are most likely secondary to a mild viral infection and recommend treating this conservatively with Tylenol  for fever or chills and increasing hydration.  Can use saline nasal spray to help with congestion.  If symptoms worsen or fail to improve then recommend returning to urgent care.  If the lower abdominal pressure worsens then recommend going to MAU for further evaluation.  Final Clinical Impressions(s) / UC Diagnoses   Final diagnoses:  Sore throat  Pregnancy related fatigue in second trimester     Discharge  Instructions      Strep testing done today was negative flu and COVID testing done today was negative.  Urinalysis does suggest some dehydration.  At this time symptoms are most likely secondary to a mild viral infection and recommend treating this conservatively with Tylenol  for fever or chills and increasing hydration.  Can use saline nasal spray to help with congestion.  If symptoms worsen or fail to improve then recommend returning to urgent care.  If the lower abdominal pressure worsens then recommend going to MAU for further evaluation.     ED Prescriptions   None    PDMP not reviewed this encounter.   Teresa Almarie LABOR, [place] 09/28/23 1933

## 2023-09-28 NOTE — Discharge Instructions (Addendum)
 Strep testing done today was negative flu and COVID testing done today was negative.  Urinalysis does suggest some dehydration.  At this time symptoms are most likely secondary to a mild viral infection and recommend treating this conservatively with Tylenol  for fever or chills and increasing hydration.  Can use saline nasal spray to help with congestion.  If symptoms worsen or fail to improve then recommend returning to urgent care.  If the lower abdominal pressure worsens then recommend going to MAU for further evaluation.

## 2023-10-23 ENCOUNTER — Encounter (HOSPITAL_COMMUNITY): Payer: Self-pay | Admitting: Obstetrics and Gynecology

## 2023-10-23 ENCOUNTER — Inpatient Hospital Stay (HOSPITAL_COMMUNITY)
Admission: AD | Admit: 2023-10-23 | Discharge: 2023-10-23 | Disposition: A | Discharge status: Home / Self Care | Attending: Obstetrics and Gynecology | Admitting: Obstetrics and Gynecology

## 2023-10-23 DIAGNOSIS — Z3A23 23 weeks gestation of pregnancy: Secondary | ICD-10-CM | POA: Diagnosis not present

## 2023-10-23 DIAGNOSIS — O98812 Other maternal infectious and parasitic diseases complicating pregnancy, second trimester: Secondary | ICD-10-CM | POA: Diagnosis not present

## 2023-10-23 DIAGNOSIS — O26892 Other specified pregnancy related conditions, second trimester: Secondary | ICD-10-CM

## 2023-10-23 DIAGNOSIS — O23592 Infection of other part of genital tract in pregnancy, second trimester: Secondary | ICD-10-CM | POA: Insufficient documentation

## 2023-10-23 DIAGNOSIS — B3731 Acute candidiasis of vulva and vagina: Secondary | ICD-10-CM | POA: Diagnosis not present

## 2023-10-23 DIAGNOSIS — R102 Pelvic and perineal pain: Secondary | ICD-10-CM | POA: Diagnosis present

## 2023-10-23 LAB — GC/CHLAMYDIA PROBE AMP (~~LOC~~) NOT AT ARMC
Chlamydia: NEGATIVE
Comment: NEGATIVE
Comment: NORMAL
Neisseria Gonorrhea: NEGATIVE

## 2023-10-23 LAB — URINALYSIS, ROUTINE W REFLEX MICROSCOPIC
Bilirubin Urine: NEGATIVE
Glucose, UA: NEGATIVE mg/dL
Hgb urine dipstick: NEGATIVE
Ketones, ur: 5 mg/dL — AB
Leukocytes,Ua: NEGATIVE
Nitrite: NEGATIVE
Protein, ur: NEGATIVE mg/dL
Specific Gravity, Urine: 1.012 (ref 1.005–1.030)
pH: 7 (ref 5.0–8.0)

## 2023-10-23 LAB — WET PREP, GENITAL
Clue Cells Wet Prep HPF POC: NONE SEEN
Sperm: NONE SEEN
Trich, Wet Prep: NONE SEEN
WBC, Wet Prep HPF POC: >=10 — AB (ref <10)
Yeast Wet Prep HPF POC: NONE SEEN

## 2023-10-23 LAB — POCT FERN TEST: POCT Fern Test: NEGATIVE

## 2023-10-23 MED ORDER — FLUCONAZOLE 150 MG PO TABS: 150.0000 mg | ORAL_TABLET | Freq: Once | ORAL | 0 refills | Status: AC

## 2023-10-23 NOTE — MAU Note (Signed)
 MAU Triage Note:  .Brandi Lucero is a 38 y.o. at [redacted]w[redacted]d here in MAU reporting: sharp lower abdominal pain and pelvic pain that is intermittent and first started yesterday morning. Reports on-going white/clear discharge that has been regular. Denies VB or watery LOF. Reports +FM.   Patient complaint: pelvic and back pain  and discharge, abd tighness  Pain Score: 8  Pain Location: Abdomen Pain Score: 3 Pain Location: Pelvis   Onset of complaint: tonight LMP: Patient's last menstrual period was 05/12/2023 (approximate).  Vitals:   10/23/23 0133  BP: 123/80  Pulse: 81  Resp: 16  SpO2: 98%    FHT:  Fetal Heart Rate Mode: External Baseline Rate (A): 145 bpm Lab orders placed from triage: UA

## 2023-10-23 NOTE — MAU Provider Note (Signed)
 History     CSN: [id number]  Arrival date and time: 10/23/23 0107   Event Date/Time   First Provider Initiated Contact with Patient 10/23/2023  1:27 AM   Chief Complaint  Patient presents with   Abdominal Pain   Pelvic Pain    HPI  Brandi Lucero is a 38 y.o. G4P0020 at [redacted]w[redacted]d who presents to the MAU for abdominal pain. Reports onset yesterday. Pain has been in low abdomen, sharp and intermittent. Has noticed increased vaginal discharge this morning, though no noted odor or itching/discomfort. No VB. No LOF. Occasional BH contractions. Normal FM.  Past Medical History:  Diagnosis Date   Anxiety    Bipolar 1 disorder (HCC)    Depression    Ectopic pregnancy    IBS (irritable bowel syndrome) 2009    Past Surgical History:  Procedure Laterality Date   DILATION AND EVACUATION  03/03/2012   ECTOPIC PREGNANCY SURGERY  03/03/2004   LAPAROSCOPY FOR ECTOPIC PREGNANCY  03/03/2004   WISDOM TOOTH EXTRACTION      Family History  Problem Relation Age of Onset   Colon cancer Father    High blood pressure Mother     Social History   Tobacco Use   Smoking status: Former    Current packs/day: 0.50    Types: Cigarettes   Smokeless tobacco: Never  Substance Use Topics   Alcohol use: Not Currently    Alcohol/week: 2.0 standard drinks of alcohol    Types: 2 Standard drinks or equivalent per week   Drug use: No    Allergies:  Allergies  Allergen Reactions   Latex Other (See Comments) and Itching   Vicodin [Hydrocodone -Acetaminophen ]     Bad headache    Penicillins Rash    No medications prior to admission.    ROS reviewed and pertinent positives and negatives as documented in HPI.  Physical Exam   Blood pressure 117/75, pulse 82, temperature 98 F (36.7 C), temperature source Oral, resp. rate 16, height 5' (1.524 m), weight 73 kg, last menstrual period 05/12/2023, SpO2 98%.  Physical Exam Exam conducted with a chaperone present.  Constitutional:      General: She  is not in acute distress.    Appearance: Normal appearance. She is not ill-appearing.  HENT:     Head: Normocephalic and atraumatic.  Cardiovascular:     Rate and Rhythm: Normal rate.  Pulmonary:     Effort: Pulmonary effort is normal.     Breath sounds: Normal breath sounds.  Abdominal:     Palpations: Abdomen is soft.     Tenderness: There is no abdominal tenderness. There is no guarding.  Genitourinary:    Comments: Cervix closed and thick on speculum exam, cervix noted to be friable; thick, chunky white discharge present in vagina, no pooling Musculoskeletal:        General: Normal range of motion.  Skin:    General: Skin is warm and dry.     Findings: No rash.  Neurological:     General: No focal deficit present.     Mental Status: She is alert and oriented to person, place, and time.   EFM: 140/mod/-a/+occ variables  MAU Course  Procedures  MDM 38 y.o. G4P0020 at [redacted]w[redacted]d presenting for abdominal discomfort and increased vaginal discharge. Cervix closed and thick with negative pooling on exam. Exam c/w vulvovaginal candidiasis.  Fern also negative. Suspect UI and increased vaginal discharge 2/2 yeast infx. Rx for Diflucan  sent. EFM AGA. Stable for d/c.  Assessment and  Plan     ICD-10-CM   1. Vulvovaginal candidiasis  B37.31     Rx for Diflucan  sent Rec rest, heat, Tylenol  prn Discharged in stable condition   Alain Sor, MD OB Fellow, Faculty Practice [facility], [facility]  10/23/2023, 5:02 AM

## 2023-11-05 IMAGING — MR MR ABDOMEN WO/W CM
10 of 17 series · 27 of 48 positions shown · IV contrast (12 ml Multihance)
Comparison: CT scan 02/06/2021

CLINICAL DATA: Evaluate liver lesions seen on recent CT scan.

EXAM:
MRI ABDOMEN WITHOUT AND WITH CONTRAST
TECHNIQUE: Multiplanar multisequence MR imaging of the abdomen was performed
both before and after the administration of intravenous contrast.
CONTRAST:  12mL MULTIHANCE GADOBENATE DIMEGLUMINE 529 MG/ML IV SOLN

[Series 3: cor haste · coronal · 5.0mm · 0.68mm/px · 2 of 32 slices shown]
[im 1/32]
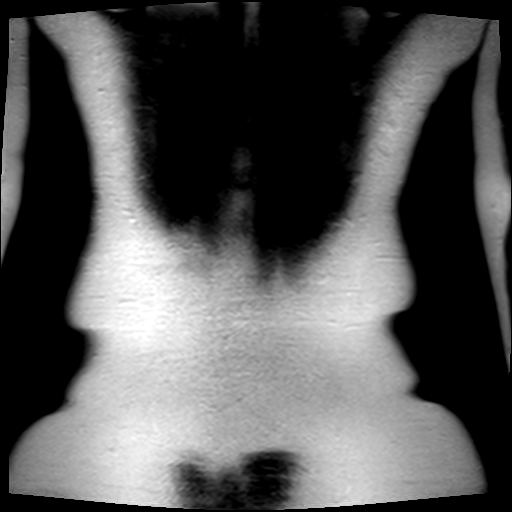
[im 32/32]
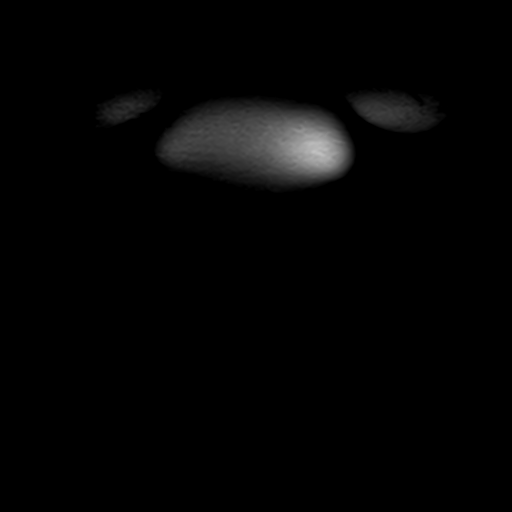

[Series 4: axial haste · axial · 6.0mm · 0.68mm/px · z∈[-37,+174]mm · 2 of 33 slices shown]
[im 1/33]
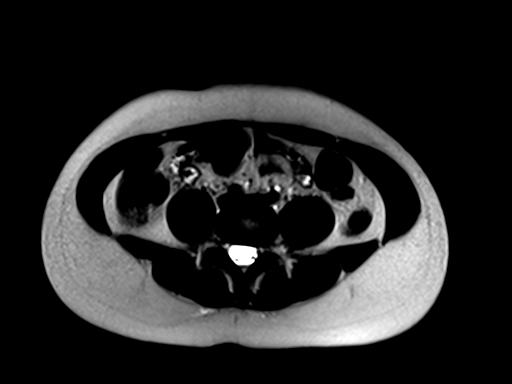
[im 33/33]
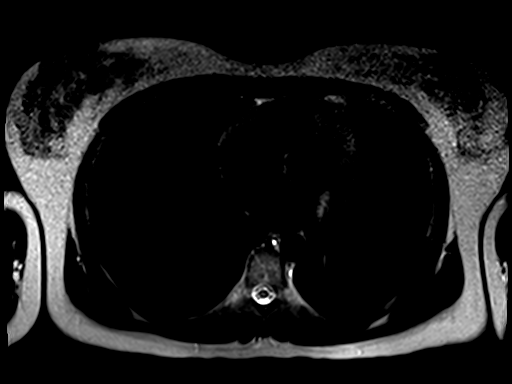

[Series 5: T1 · axial · 6.0mm · 0.68mm/px · z∈[-35,+176]mm · 4 of 66 slices shown]
[im 1/66]
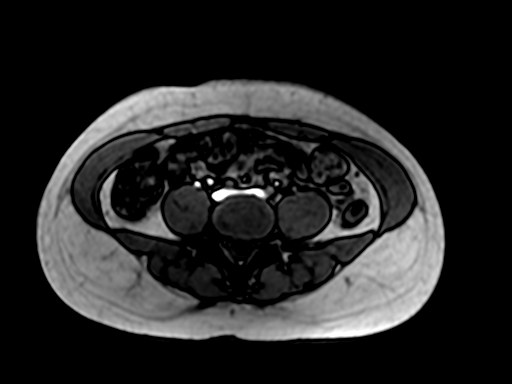
[im 22/66]
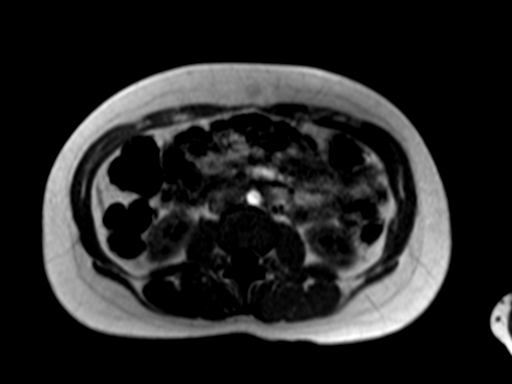
[im 44/66]
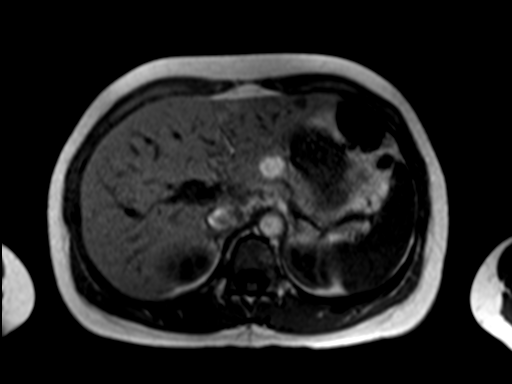
[im 66/66]
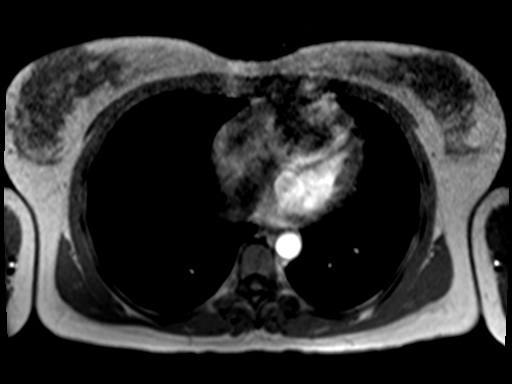

[Series 6: bSSFP · axial · 4.0mm · 0.68mm/px · z∈[-40,+200]mm · 3 of 61 slices shown]
[im 1/61]
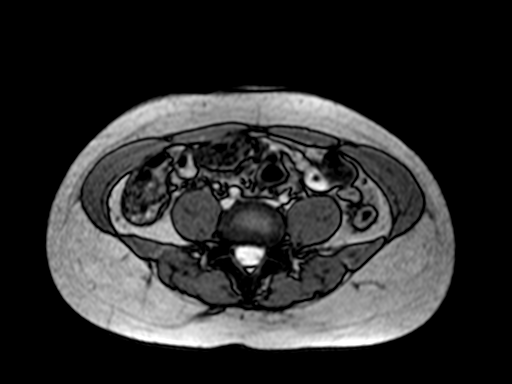
[im 31/61]
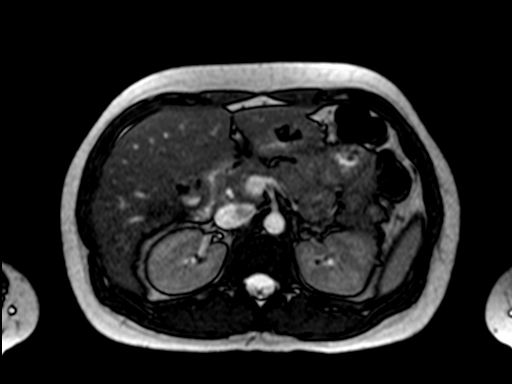
[im 61/61]
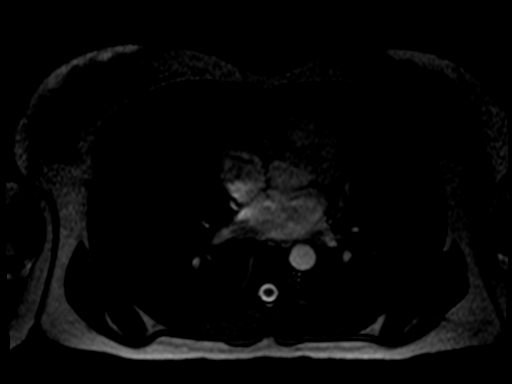

[Series 7: T2 fat-sat · axial · 6.0mm · 1.09mm/px · z∈[-13,+210]mm · 2 of 32 slices shown]
[im 1/32]
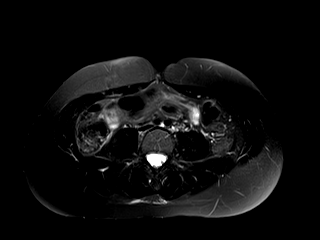
[im 32/32]
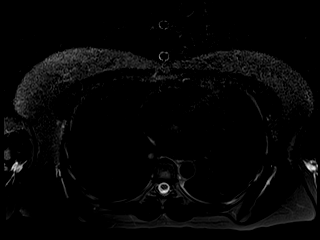

[Series 8: ep2d_[id number]_800_p2_trig · axial · 6.0mm · 1.82mm/px · z∈[-11,+212]mm · 4 of 96 slices shown]
[im 1/96]
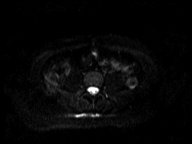
[im 32/96]
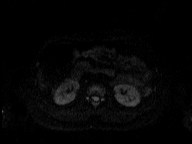
[im 64/96]
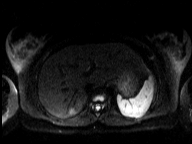
[im 96/96]
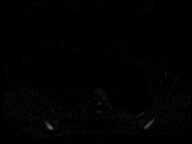

[Series 9: [id number]_800_p2_trig_adc · axial · 6.0mm · 1.82mm/px · 1 of 32 slices shown]
[im 1/32]
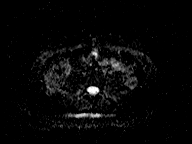

[Series 10: T1 dynamic · axial · non-contrast · 2.5mm · 0.74mm/px · z∈[-8,+210]mm · 3 of 88 slices shown]
[im 1/88]
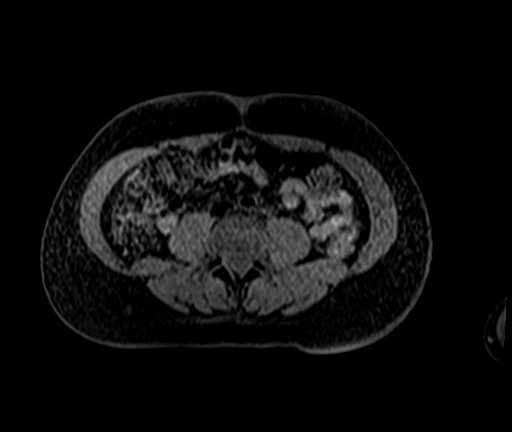
[im 44/88]
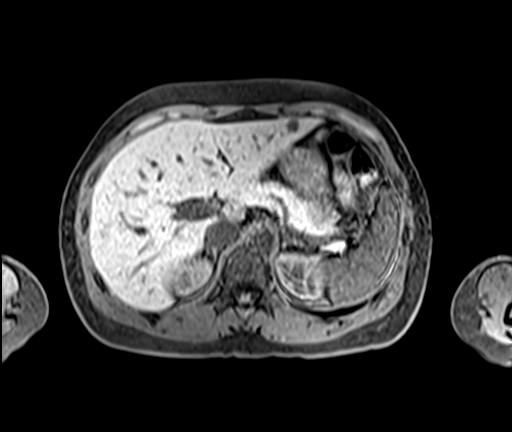
[im 88/88]
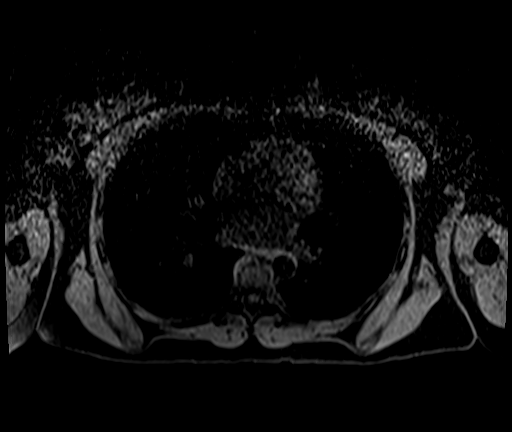

[Series 11: T1 dynamic post-contrast · axial · 2.5mm · 0.74mm/px · z∈[-8,+210]mm · 3 of 88 slices shown (1 of 2)]
[im 1/88]
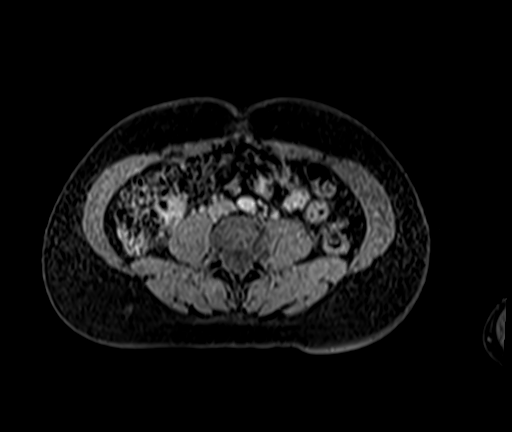
[im 44/88]
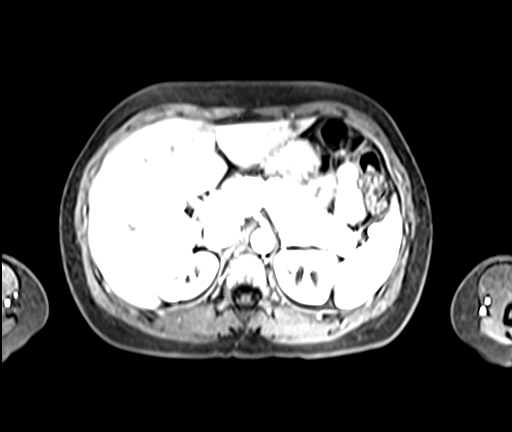
[im 88/88]
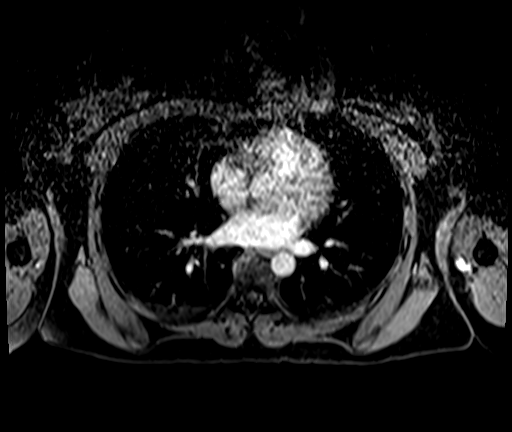

[Series 12: T1 dynamic post-contrast · axial · 2.5mm · 0.74mm/px · z∈[-8,+210]mm · 3 of 88 slices shown (2 of 2)]
[im 1/88]
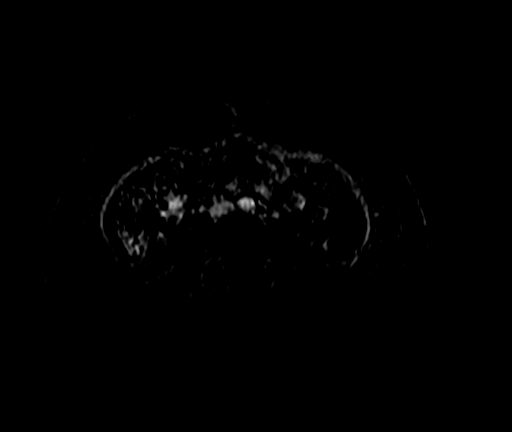
[im 44/88]
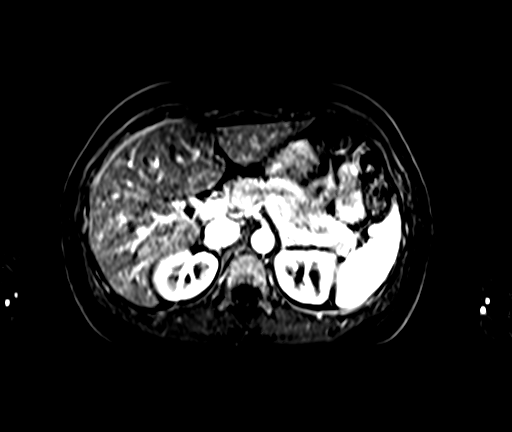
[im 88/88]
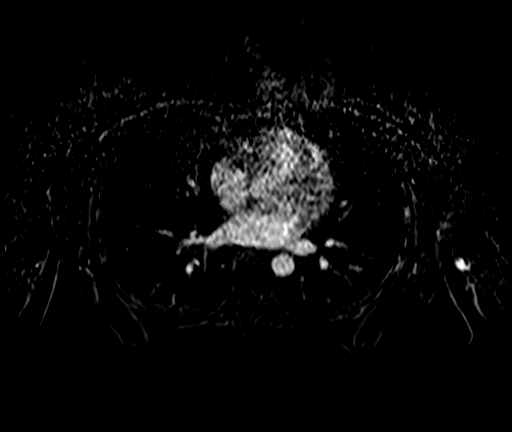

[27 of 48 positions shown; findings below may reference images not displayed]

FINDINGS: Lower chest: The lung bases are clear of an acute process. No
pulmonary lesions or pleural effusions. No pericardial effusion.

Hepatobiliary: Multiple hepatic lesions are identified as seen on
the recent CT scan.

Lobulated T2 hyperintense lesion in segment 3 demonstrates
progressive but incomplete peripheral nodular enhancement. Findings
consistent with a benign hemangioma.

Similar lesion in segment 5 measures 9 mm. Similar enhancement
pattern consistent with hemangioma.

2.8 x 2.1 cm lesion posteriorly and segment [DATE] demonstrates very
slight increased T2 signal intensity but the lesion is fairly
inconspicuous. Demonstrates early arterial phase enhancement except
for some central areas of non enhancement. This lesion washes out
quickly and there is delayed enhancement of the central areas.
Findings are fairly typical for FNH.

Similar imaging features of the segment 7 lesion measuring 2.9 x
cm. This is also consistent with FNH.

1.8 x 1.5 cm lesion in segment 7 is also likely a benign area of
focal nodular hyperplasia.

There is another lesion in segment 3 measuring 1.5 cm and another
lesion in segment 6 measuring 1.4 cm.

9 mm lesion in segment 6 is also similar.

The gallbladder is very difficult to see on the T2 weighted images
suggesting that it is not fluid-filled. Dark T1 signal intensity and
slight enhancement of the mucosa. Could not exclude stone filled
gallbladder. Right upper quadrant ultrasound may be helpful for
further evaluation. I do not see any history of a prior
cholecystectomy. Normal caliber and course of the common bile duct.

Pancreas:  The pancreas is unremarkable.

Spleen:  Normal splenic size. No splenic lesions.

Adrenals/Urinary Tract: The adrenal glands and kidneys are
unremarkable.

Stomach/Bowel: Stomach, duodenum, visualized small bowel and
visualized colon are unremarkable.

Vascular/Lymphatic: The aorta and branch vessels are patent. The
major venous structures are patent. No mesenteric or retroperitoneal
adenopathy.

Other:  Enlarged fibroid uterus is noted.

Musculoskeletal: No significant bony findings.
IMPRESSION: 1. Numerous hepatic lesions. Two of these are benign hemangiomas and
the others have imaging features of benign FNH. Hepatic adenomas
would also be possible but I think less likely. Would recommend a
follow-up MR abdomen without and with contrast in 6 months using
Eovist for contrast material.
2. Enlarged fibroid uterus.
3. No acute abdominal findings or adenopathy.
4. The gallbladder is very difficult to see on the T2 weighted
images suggesting that it is not fluid-filled. It could be stone
filled. Mild mucosal enhancement is noted. Right upper quadrant
ultrasound may be helpful for further evaluation.

## 2023-11-17 LAB — OB RESULTS CONSOLE HIV ANTIBODY (ROUTINE TESTING): HIV: NONREACTIVE

## 2023-11-17 LAB — OB RESULTS CONSOLE RPR: RPR: NONREACTIVE

## 2023-11-21 ENCOUNTER — Emergency Department
Admission: EM | Admit: 2023-11-21 | Discharge: 2023-11-21 | Disposition: A | Discharge status: Home / Self Care | Attending: Emergency Medicine | Admitting: Emergency Medicine

## 2023-11-21 ENCOUNTER — Other Ambulatory Visit: Payer: Self-pay

## 2023-11-21 ENCOUNTER — Emergency Department

## 2023-11-21 DIAGNOSIS — M79662 Pain in left lower leg: Secondary | ICD-10-CM | POA: Insufficient documentation

## 2023-11-21 DIAGNOSIS — M79605 Pain in left leg: Secondary | ICD-10-CM

## 2023-11-21 DIAGNOSIS — Z3A28 28 weeks gestation of pregnancy: Secondary | ICD-10-CM | POA: Diagnosis not present

## 2023-11-21 DIAGNOSIS — O26892 Other specified pregnancy related conditions, second trimester: Secondary | ICD-10-CM | POA: Diagnosis present

## 2023-11-21 DIAGNOSIS — O99891 Other specified diseases and conditions complicating pregnancy: Secondary | ICD-10-CM | POA: Insufficient documentation

## 2023-11-21 HISTORY — DX: Type 2 diabetes mellitus without complications: E11.9

## 2023-11-21 LAB — CBC WITH DIFFERENTIAL/PLATELET
Abs Immature Granulocytes: 0.03 K/uL (ref 0.00–0.07)
Basophils Absolute: 0 K/uL (ref 0.0–0.1)
Basophils Relative: 0 %
Eosinophils Absolute: 0 K/uL (ref 0.0–0.5)
Eosinophils Relative: 0 %
HCT: 30.7 % — ABNORMAL LOW (ref 36.0–46.0)
Hemoglobin: 9.7 g/dL — ABNORMAL LOW (ref 12.0–15.0)
Immature Granulocytes: 0 %
Lymphocytes Relative: 16 %
Lymphs Abs: 1.2 K/uL (ref 0.7–4.0)
MCH: 26 pg (ref 26.0–34.0)
MCHC: 31.6 g/dL (ref 30.0–36.0)
MCV: 82.3 fL (ref 80.0–100.0)
Monocytes Absolute: 0.4 K/uL (ref 0.1–1.0)
Monocytes Relative: 6 %
Neutro Abs: 5.4 K/uL (ref 1.7–7.7)
Neutrophils Relative %: 78 %
Platelets: 392 K/uL (ref 150–400)
RBC: 3.73 MIL/uL — ABNORMAL LOW (ref 3.87–5.11)
RDW: 15.2 % (ref 11.5–15.5)
WBC: 7.1 K/uL (ref 4.0–10.5)
nRBC: 0 % (ref 0.0–0.2)

## 2023-11-21 LAB — BASIC METABOLIC PANEL WITH GFR
Anion gap: 13 (ref 5–15)
BUN: 8 mg/dL (ref 6–20)
CO2: 19 mmol/L — ABNORMAL LOW (ref 22–32)
Calcium: 8.3 mg/dL — ABNORMAL LOW (ref 8.9–10.3)
Chloride: 102 mmol/L (ref 98–111)
Creatinine, Ser: 0.45 mg/dL (ref 0.44–1.00)
GFR, Estimated: >60 mL/min (ref >60)
Glucose, Bld: 75 mg/dL (ref 70–99)
Potassium: 3.6 mmol/L (ref 3.5–5.1)
Sodium: 134 mmol/L — ABNORMAL LOW (ref 135–145)

## 2023-11-21 NOTE — ED Provider Notes (Signed)
 [facility] Provider Note    Event Date/Time   First MD Initiated Contact with Patient 11/21/23 1122     (approximate)   History   DVT rule out   HPI  Brandi Lucero is a 38 y.o. female G1, P0 currently estimated to be [redacted] weeks pregnant who presents today for evaluation of left lower leg pain.  Patient reports this started approximately 6 days ago with what she felt was a charley horse.  She reports that she did a lot of walking and her symptoms improved.  However, she feels that 5 days ago she began to have pain in her calf again, and had a spread superiorly to her thigh.  She has not noticed any swelling.  She has no shortness of breath or chest pain.  She has not had any history of PE or DVT.  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  Patient Active Problem List   Diagnosis Date Noted   Allergic rhinitis 03/01/2014   Bipolar 2 disorder (HCC) 02/10/2014   Anxiety 02/10/2014          Physical Exam   Triage Vital Signs: ED Triage Vitals  Encounter Vitals Group     BP 11/21/23 1048 119/81     Girls Systolic BP Percentile --      Girls Diastolic BP Percentile --      Boys Systolic BP Percentile --      Boys Diastolic BP Percentile --      Pulse Rate 11/21/23 1048 91     Resp 11/21/23 1048 16     Temp 11/21/23 1048 98.1 F (36.7 C)     Temp Source 11/21/23 1048 Oral     SpO2 11/21/23 1048 98 %     Weight 11/21/23 1047 160 lb (72.6 kg)     Height 11/21/23 1047 5' (1.524 m)     Head Circumference --      Peak Flow --      Pain Score 11/21/23 1045 8     Pain Loc --      Pain Education --      Exclude from Growth Chart --     Most recent vital signs: Vitals:   11/21/23 1048  BP: 119/81  Pulse: 91  Resp: 16  Temp: 98.1 F (36.7 C)  SpO2: 98%    Physical Exam Vitals and nursing note reviewed.  Constitutional:      General: Awake and alert.  No acute distress.    Appearance: Normal appearance. The patient is normal weight.  HENT:     Head: Normocephalic and atraumatic.     Mouth: Mucous membranes are moist.  Eyes:     General: PERRL. Normal EOMs        Right eye: No discharge.        Left eye: No discharge.     Conjunctiva/sclera: Conjunctivae normal.  Cardiovascular:     Rate and Rhythm: Normal rate and regular rhythm.     Pulses: Normal pulses.  Pulmonary:     Effort: Pulmonary effort is normal. No respiratory distress.     Breath sounds: Normal breath sounds.  Abdominal:     Abdomen is soft. There is no abdominal tenderness. No rebound or guarding. No distention. Musculoskeletal:        General: No swelling. Normal range of motion.  Cervical back: Normal range of motion and neck supple.  Left leg: Mild tenderness to palpation to left calf and left thigh, no swelling or pitting edema noted.  No wounds noted.  No erythema or ecchymosis.  Full and normal range of motion of ankle, knee, hip.  Normal distal pulses.  Foot is warm and well-perfused.  Compartments are soft compressible throughout. Skin:    General: Skin is warm and dry.     Capillary Refill: Capillary refill takes less than 2 seconds.     Findings: No rash.  Neurological:     Mental Status: The patient is awake and alert.      ED Results / Procedures / Treatments   Labs (all labs ordered are listed, but only abnormal results are displayed) Labs Reviewed  BASIC METABOLIC PANEL WITH GFR - Abnormal; Notable for the following components:      Result Value   Sodium 134 (*)    CO2 19 (*)    Calcium 8.3 (*)    All other components within normal limits  CBC WITH DIFFERENTIAL/PLATELET - Abnormal; Notable for the following components:   RBC 3.73 (*)    Hemoglobin 9.7 (*)    HCT 30.7 (*)    All other components within normal limits     EKG     RADIOLOGY I independently reviewed and interpreted imaging and agree with radiologists  findings.     PROCEDURES:  Critical Care performed:   Procedures   MEDICATIONS ORDERED IN ED: Medications - No data to display   IMPRESSION / MDM / ASSESSMENT AND PLAN / ED COURSE  I reviewed the triage vital signs and the nursing notes.   Differential diagnosis includes, but is not limited to, DVT, dehydration, electrolyte disarray, muscle spasm, muscle strain.  Patient was seen at the [facility] walk-in clinic just prior to arrival.  She was sent to the ER for DVT rule out.  Patient is awake and alert, hemodynamically stable and afebrile.  Agnostic concerns or complaints today.  There is no obvious swelling noted on exam.  No pitting edema, legs appear to be equal in circumference bilaterally.  Labs obtained for evaluation of electrolyte disarray or dehydration, and ultrasound obtained for evaluation of DVT.  Labs are overall reassuring, and ultrasound is negative for DVT.  There was mention of slow flow.  We discussed wearing compression stockings.  Also discussed importance of proper hydration.  We discussed return precautions and follow-up with OB/GYN.  Patient understands and agrees with plan.  She was discharged in stable condition.  Patient's presentation is most consistent with acute complicated illness / injury requiring diagnostic workup.     FINAL CLINICAL IMPRESSION(S) / ED DIAGNOSES   Final diagnoses:  Left leg pain     Rx / DC Orders   ED Discharge Orders     None        Note:  This document was prepared using Dragon voice recognition software and may include unintentional dictation errors.   Yeilin Zweber E, PA-C 11/21/23 1727    Arlander Charleston, MD 11/22/23 [phone]

## 2023-11-21 NOTE — Discharge Instructions (Signed)
 Your ultrasound does not reveal any blood clots.  Your electrolytes are reassuring.  Please follow-up with your OB/GYN.  Please remember to stay hydrated.  Please return for any new, worsening, or changing symptoms or other concerns.  It was a pleasure caring for you today.

## 2023-11-21 NOTE — ED Triage Notes (Signed)
 To ED from [facility] for upper anterior and posterior L calf pain with tenderness since 5 days ago. No warmth or discoloration. Pain is worse with walking. Pain radiates to behind knee.  [redacted] weeks pregnant. 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  Takes 81mg  aspirin daily. No hx DVT. Sent for DVT rule out. VSS at [facility]. No SOB.

## 2023-11-21 NOTE — ED Notes (Signed)
 Spoke with Dr Nicholaus, only u/s venous for now.

## 2023-11-25 LAB — BASIC METABOLIC PANEL WITH GFR: Glucose: 81

## 2023-12-20 ENCOUNTER — Encounter (HOSPITAL_COMMUNITY): Payer: Self-pay | Admitting: Obstetrics and Gynecology

## 2023-12-20 ENCOUNTER — Inpatient Hospital Stay (HOSPITAL_COMMUNITY)
Admission: AD | Admit: 2023-12-20 | Discharge: 2023-12-20 | Disposition: A | Discharge status: Home / Self Care | Attending: Obstetrics and Gynecology | Admitting: Obstetrics and Gynecology

## 2023-12-20 DIAGNOSIS — Z3A31 31 weeks gestation of pregnancy: Secondary | ICD-10-CM | POA: Diagnosis not present

## 2023-12-20 DIAGNOSIS — U071 COVID-19: Secondary | ICD-10-CM | POA: Diagnosis not present

## 2023-12-20 DIAGNOSIS — R0989 Other specified symptoms and signs involving the circulatory and respiratory systems: Secondary | ICD-10-CM

## 2023-12-20 DIAGNOSIS — O98513 Other viral diseases complicating pregnancy, third trimester: Secondary | ICD-10-CM | POA: Diagnosis present

## 2023-12-20 LAB — URINALYSIS, ROUTINE W REFLEX MICROSCOPIC
Bilirubin Urine: NEGATIVE
Glucose, UA: NEGATIVE mg/dL
Hgb urine dipstick: NEGATIVE
Ketones, ur: 20 mg/dL — AB
Nitrite: NEGATIVE
Protein, ur: NEGATIVE mg/dL
Specific Gravity, Urine: 1.012 (ref 1.005–1.030)
pH: 6 (ref 5.0–8.0)

## 2023-12-20 LAB — RESP PANEL BY RT-PCR (RSV, FLU A&B, COVID)  RVPGX2
Influenza A by PCR: NEGATIVE
Influenza B by PCR: NEGATIVE
Resp Syncytial Virus by PCR: NEGATIVE
SARS Coronavirus 2 by RT PCR: POSITIVE — AB

## 2023-12-20 MED ORDER — PHENOL 1.4 % MT LIQD
1.0000 | OROMUCOSAL | Status: DC | PRN
  Administered 2023-12-20: 1 via OROMUCOSAL
  Filled 2023-12-20: qty 177

## 2023-12-20 MED ORDER — BENZONATATE 100 MG PO CAPS
200.0000 mg | ORAL_CAPSULE | Freq: Once | ORAL | Status: AC
  Administered 2023-12-20: 200 mg via ORAL
  Filled 2023-12-20: qty 2

## 2023-12-20 MED ORDER — LORATADINE 10 MG PO TABS
10.0000 mg | ORAL_TABLET | Freq: Every day | ORAL | Status: DC
  Administered 2023-12-20: 10 mg via ORAL
  Filled 2023-12-20: qty 1

## 2023-12-20 MED ORDER — LORATADINE 10 MG PO TABS: 10.0000 mg | ORAL_TABLET | Freq: Every day | ORAL | Status: DC

## 2023-12-20 NOTE — MAU Note (Signed)
 MAU Triage Note: Brandi Lucero is a 38 y.o. at [redacted]w[redacted]d here in MAU reporting: Has not been feeling well since Thursday. Reports sore throat, cough, sneezing, and body aches. She called the triage nurse and was instructed to take Robitussin without alcohol. She started medicating yesterday, but has not felt any better. She is also now experiencing pain in her left ear. Denies VB or LOF. Reports +FM.   Patient complaint: cough, body ache  Pain Score: 6  Pain Location: Ear Pain Score: 9 Pain Location: Generalized   Onset of complaint: on-going LMP: Patient's last menstrual period was 05/12/2023 (approximate).  Vitals:   12/20/23 0200  BP: 109/74  Pulse: 98  Resp: 18  Temp: 98.2 F (36.8 C)  SpO2: 96%    FHT:  Fetal Heart Rate Mode: Doppler Baseline Rate (A): 143 bpm Multiple birth?: No Lab orders placed from triage: UA, Resp swab

## 2023-12-20 NOTE — MAU Provider Note (Signed)
 History     CSN: [id number]  Arrival date and time: 12/20/23 0146   Event Date/Time   First Provider Initiated Contact with Patient 12/20/23 0246      Chief Complaint  Patient presents with   Headache   Sore Throat   Cough   Generalized Body Aches   Nasal Congestion   HPI [name] is a 38 y.o. year old [id number] female at [redacted]w[redacted]d weeks gestation who presents to MAU reporting not feeling well since Thursday.  She reports her symptoms of sore throat, cough, sneezing, body aches.  She was instructed by the on-call nurse to take Robitussin without alcohol.  She reports taking that since yesterday but not feeling any better.  She also reports ear pain that she rates 6/10 and body pain she rates 9/10. She was receiving [facility] with [facility], but is having to transfer to [facility]; next appt is 12/21/2023. Her partner is present and contributing to the history taking.    OB History     Gravida  4   Para      Term      Preterm      AB  3   Living         SAB  2   IAB      Ectopic  1   Multiple      Live Births              Past Medical History:  Diagnosis Date   Anxiety    Bipolar 1 disorder (HCC)    Cheekbone fracture (HCC)    Depression    Diabetes mellitus without complication (HCC)    failed 1 hour GD screening test (gestational DM)   Ectopic pregnancy    IBS (irritable bowel syndrome) 2009   Restless leg syndrome     Past Surgical History:  Procedure Laterality Date   DILATION AND EVACUATION  03/03/2012   ECTOPIC PREGNANCY SURGERY  03/03/2004   LAPAROSCOPY FOR ECTOPIC PREGNANCY  03/03/2004   WISDOM TOOTH EXTRACTION      Family History  Problem Relation Age of Onset   High blood pressure Mother    Colon cancer Father    Prostate cancer Father    Alzheimer's disease Maternal Grandmother    Emphysema Maternal Grandfather    Alzheimer's disease Paternal Grandmother    Prostate cancer Paternal Grandfather    Cervical cancer Neg Hx     Uterine cancer Neg Hx     Social History   Tobacco Use   Smoking status: Former    Current packs/day: 0.50    Types: Cigarettes   Smokeless tobacco: Never  Vaping Use   Vaping status: Never Used  Substance Use Topics   Alcohol use: Not Currently    Alcohol/week: 2.0 standard drinks of alcohol    Types: 2 Standard drinks or equivalent per week   Drug use: No    Allergies:  Allergies  Allergen Reactions   Latex Other (See Comments) and Itching   Vicodin [Hydrocodone -Acetaminophen ]     Bad headache    Penicillins Rash    Medications Prior to Admission  Medication Sig Dispense Refill Last Dose/Taking   aspirin EC 81 MG tablet Take 81 mg by mouth daily.   12/19/2023   Prenatal Vit-Fe Fumarate-FA (PRENATAL PO) Take by mouth.   12/19/2023   simethicone  (MYLICON) 80 MG chewable tablet Chew 80 mg by mouth 4 (four) times daily - after meals and at bedtime.  Past Week   albuterol  (PROVENTIL ) (2.5 MG/3ML) 0.083% nebulizer solution Take 3 mLs (2.5 mg total) by nebulization every 6 (six) hours as needed for wheezing or shortness of breath. 75 mL 12 More than a month   albuterol  (VENTOLIN  HFA) 108 (90 Base) MCG/ACT inhaler Inhale 1 puff into the lungs every 4 (four) hours as needed.   More than a month   ALPRAZolam (XANAX) 0.25 MG tablet Take 0.25 mg by mouth daily.   More than a month   cyclobenzaprine  (FLEXERIL ) 5 MG tablet Take 5 mg by mouth at bedtime as needed.   More than a month   escitalopram (LEXAPRO) 20 MG tablet Take 20 mg by mouth daily.   More than a month   ondansetron  (ZOFRAN -ODT) 4 MG disintegrating tablet Take 1 tablet (4 mg total) by mouth every 8 (eight) hours as needed for nausea or vomiting. 15 tablet 0    Prenatal Vit-Fe Fumarate-FA (PNV FOLIC ACID + IRON PO) Take 1 capsule by mouth daily.      progesterone (PROMETRIUM) 200 MG capsule Take 200 mg by mouth at bedtime.      rOPINIRole (REQUIP) 1 MG tablet Take 1 mg by mouth at bedtime.   More than a month    scopolamine  (TRANSDERM-SCOP) 1 MG/3DAYS Place 1 patch (1.5 mg total) onto the skin every 3 (three) days. 10 patch 12     Review of Systems  Constitutional:  Positive for fatigue.  HENT:  Positive for congestion, sneezing and sore throat.   Eyes: Negative.   Respiratory:  Positive for cough.   Cardiovascular: Negative.   Gastrointestinal: Negative.   Endocrine: Negative.   Genitourinary: Negative.   Musculoskeletal: Negative.   Skin: Negative.   Allergic/Immunologic: Negative.   Neurological: Negative.   Hematological: Negative.   Psychiatric/Behavioral: Negative.     Physical Exam   Blood pressure 106/69, pulse 91, temperature 98.2 F (36.8 C), temperature source Oral, resp. rate 18, height 5' (1.524 m), weight 70.9 kg, last menstrual period 05/12/2023, SpO2 96%.  Physical Exam Vitals and nursing note reviewed.  Constitutional:      Appearance: Normal appearance. She is normal weight.  Cardiovascular:     Rate and Rhythm: Normal rate.  Pulmonary:     Effort: Pulmonary effort is normal.  Abdominal:     Palpations: Abdomen is soft.  Genitourinary:    Comments: Not indicated Musculoskeletal:        General: Normal range of motion.  Skin:    General: Skin is warm and dry.  Neurological:     Mental Status: She is alert and oriented to person, place, and time.  Psychiatric:        Mood and Affect: Mood normal.        Behavior: Behavior normal.        Thought Content: Thought content normal.        Judgment: Judgment normal.    REACTIVE NST - FHR: 145 bpm / moderate variability / accels present / decels absent / TOCO: 1 UC with UI noted MAU Course  Procedures  MDM CCUA Respiratory Panel  Results for orders placed or performed during the hospital encounter of 12/20/23 (from the past 48 hours)  Urinalysis, Routine w reflex microscopic -Urine, Clean Catch     Status: Abnormal   Collection Time: 12/20/23  2:05 AM  Result Value Ref Range   Color, Urine YELLOW YELLOW    APPearance HAZY (A) CLEAR   Specific Gravity, Urine 1.012 1.005 - 1.030  pH 6.0 5.0 - 8.0   Glucose, UA NEGATIVE NEGATIVE mg/dL   Hgb urine dipstick NEGATIVE NEGATIVE   Bilirubin Urine NEGATIVE NEGATIVE   Ketones, ur 20 (A) NEGATIVE mg/dL   Protein, ur NEGATIVE NEGATIVE mg/dL   Nitrite NEGATIVE NEGATIVE   Leukocytes,Ua SMALL (A) NEGATIVE   RBC / HPF 0-5 0 - 5 RBC/hpf   WBC, UA 6-10 0 - 5 WBC/hpf   Bacteria, UA MANY (A) NONE SEEN   Squamous Epithelial / HPF 11-20 0 - 5 /HPF   Mucus PRESENT     Comment: Performed at [facility], [address] [place], [place] [address]  Resp panel by RT-PCR (RSV, Flu A&B, Covid) Anterior Nasal Swab     Status: Abnormal   Collection Time: 12/20/23  2:05 AM   Specimen: Anterior Nasal Swab  Result Value Ref Range   SARS Coronavirus 2 by RT PCR POSITIVE (A) NEGATIVE   Influenza A by PCR NEGATIVE NEGATIVE   Influenza B by PCR NEGATIVE NEGATIVE    Comment: (NOTE) The Xpert Xpress SARS-CoV-2/FLU/RSV plus assay is intended as an aid in the diagnosis of influenza from Nasopharyngeal swab specimens and should not be used as a sole basis for treatment. Nasal washings and aspirates are unacceptable for Xpert Xpress SARS-CoV-2/FLU/RSV testing.  Fact Sheet for Patients: BloggerCourse.com  Fact Sheet for Healthcare Providers: SeriousBroker.it  This test is not yet approved or cleared by the [place]  FDA and has been authorized for detection and/or diagnosis of SARS-CoV-2 by FDA under an Emergency Use Authorization (EUA). This EUA will remain in effect (meaning this test can be used) for the duration of the COVID-19 declaration under Section 564(b)(1) of the Act, 21 U.S.C. section 360bbb-3(b)(1), unless the authorization is terminated or revoked.     Resp Syncytial Virus by PCR NEGATIVE NEGATIVE    Comment: (NOTE) Fact Sheet for Patients: BloggerCourse.com  Fact  Sheet for Healthcare Providers: SeriousBroker.it  This test is not yet approved or cleared by the [place]  FDA and has been authorized for detection and/or diagnosis of SARS-CoV-2 by FDA under an Emergency Use Authorization (EUA). This EUA will remain in effect (meaning this test can be used) for the duration of the COVID-19 declaration under Section 564(b)(1) of the Act, 21 U.S.C. section 360bbb-3(b)(1), unless the authorization is terminated or revoked.  Performed at [facility], [address] [place], [place] [address]     Assessment and Plan  1. COVID-19 affecting pregnancy in third trimester (Primary) - Information provided on pregnancy and COVID-19 and quarantine and isolation   2. Upper respiratory symptom - Information provided on safe meds in pregnancy list   3. [redacted] weeks gestation of pregnancy   - Discharge home - Keep scheduled appt with [facility] on 12/21/2023 - Patient verbalized an understanding of the plan of care and agrees.   Ala Cart, CNM 12/20/2023, 2:46 AM

## 2023-12-20 NOTE — Discharge Instructions (Signed)

## 2023-12-21 ENCOUNTER — Telehealth

## 2023-12-21 ENCOUNTER — Telehealth: Payer: Self-pay

## 2023-12-21 DIAGNOSIS — O0993 Supervision of high risk pregnancy, unspecified, third trimester: Secondary | ICD-10-CM | POA: Insufficient documentation

## 2023-12-21 DIAGNOSIS — O26843 Uterine size-date discrepancy, third trimester: Secondary | ICD-10-CM

## 2023-12-21 DIAGNOSIS — Z3689 Encounter for other specified antenatal screening: Secondary | ICD-10-CM

## 2023-12-21 DIAGNOSIS — Z3480 Encounter for supervision of other normal pregnancy, unspecified trimester: Secondary | ICD-10-CM | POA: Insufficient documentation

## 2023-12-21 NOTE — Telephone Encounter (Signed)
 Patient was seen today on mychart video for NOB intake and expressed concerns about fetal growth since she was told that fetus was measuring two weeks smaller. Patient states that she has not had a follow up ultrasound and is requesting one be done. A future order has been placed in chart, will you contact patient and get her scheduled? Thanks. KW

## 2023-12-21 NOTE — Progress Notes (Signed)
 New OB Intake  I connected with  Brandi Lucero on 12/21/23 at  1:15 PM EDT by MyChart Video Visit and verified that I am speaking with the correct person using two identifiers. Nurse is located at [facility] and pt is located at home.  I discussed the limitations, risks, security and privacy concerns of performing an evaluation and management service by telephone and the availability of in person appointments. I also discussed with the patient that there may be a patient responsible charge related to this service. The patient expressed understanding and agreed to proceed.  I explained I am completing New OB Intake today. We discussed her EDD of 02/13/24  that is based on LMP of 05/12/23. Pt is G4/P0. I reviewed her allergies, medications, Medical/Surgical/OB history, and appropriate screenings. There are cats in the home: no.. Based on history, this is a/an pregnancy uncomplicated . Her obstetrical history is significant for none.  Patient Active Problem List   Diagnosis Date Noted   Supervision of other normal pregnancy, antepartum 12/21/2023   Allergic rhinitis 03/01/2014   Bipolar 2 disorder (HCC) 02/10/2014   Anxiety 02/10/2014    Concerns addressed today:   Delivery Plans:  Plans to deliver at [facility].  Anatomy US  . Anatomy US  will be scheduled around [redacted] weeks gestational age.  Labs Discussed genetic screening with patient. Discussed possible labs to be drawn at new OB appointment.  COVID Vaccine Patient has had COVID vaccine.   Social Determinants of Health Food Insecurity: denies food insecurity WIC Referral: Patient is interested in referral to [facility].  Transportation: Patient denies transportation needs. Childcare: Discussed no children allowed at ultrasound appointments.   First visit review I reviewed new OB appt with pt. I explained she will have blood work and pap smear/pelvic exam if indicated. Explained pt will be seen by Brandi Lucero at  first visit; encounter routed to appropriate provider.   Brandi Lucero, CMA 12/21/2023  2:16 PM

## 2023-12-21 NOTE — Patient Instructions (Signed)
 Third Trimester of Pregnancy  The third trimester of pregnancy is from week 28 through week 40. This is months 7 through 9. The third trimester is a time when your baby is growing fast. Body changes during your third trimester Your body continues to change during this time. The changes usually go away after your baby is born. Physical changes You will continue to gain weight. You may get stretch marks on your hips, belly, and breasts. Your breasts will keep growing and may hurt. A yellow fluid (colostrum) may leak from your breasts. This is the first milk you're making for your baby. Your hair may grow faster and get thicker. In some cases, you may get hair loss. Your belly button may stick out. You may have more swelling in your hands, face, or ankles. Health changes You may have heartburn. You may feel short of breath. This is caused by the uterus that is now bigger. You may have more aches in the pelvis, back, or thighs. You may have more tingling or numbness in your hands, arms, and legs. You may pee more often. You may have trouble pooping (constipation) or swollen veins in the butt that can itch or get painful (hemorrhoids). Other changes You may have more problems sleeping. You may notice the baby moving lower in your belly (dropping). You may have more fluid coming from your vagina. Your joints may feel loose, and you may have pain around your pelvic bone. Follow these instructions at home: Medicines Take medicines only as told by your health care provider. Some medicines are not safe during pregnancy. Your provider may change the medicines that you take. Do not take any medicines unless told to by your provider. Take a prenatal vitamin that has at least 600 micrograms (mcg) of folic acid. Do not use herbal medicines, illegal drugs, or medicines that are not approved by your provider. Eating and drinking While you're pregnant your body needs additional nutrition to help  support your growing baby. Talk with your provider about your nutritional needs. Activity Most women are able to exercise regularly during pregnancy. Exercise routines may need to change at the end of your pregnancy. Talk to your provider about your activities and exercise routine. Relieving pain and discomfort Rest often with your legs raised if you have leg cramps or low back pain. Take warm sitz baths to soothe pain from hemorrhoids. Use hemorrhoid cream if your provider says it's okay. Wear a good, supportive bra if your breasts hurt. Do not use hot tubs, steam rooms, or saunas. Do not douche. Do not use tampons or scented pads. Safety Talk to your provider before traveling far distances. Wear your seatbelt at all times when you're in a car. Talk to your provider if someone hits you, hurts you, or yells at you. Preparing for birth To prepare for your baby: Take childbirth and breastfeeding classes. Visit the hospital and tour the maternity area. Buy a rear-facing car seat. Learn how to install it in your car. General instructions Avoid cat litter boxes and soil used by cats. These things carry germs that can cause harm to your pregnancy and your baby. Do not drink alcohol, smoke, vape, or use products with nicotine or tobacco in them. If you need help quitting, talk with your provider. Keep all follow-up visits for your third trimester. Your provider will do more exams and tests during this trimester. Write down your questions. Take them to your prenatal visits. Your provider also will: Talk with you about  your overall health. Give you advice or refer you to specialists who can help with different needs, including: Mental health and counseling. Foods and healthy eating. Ask for help if you need help with food. Where to find more information American Pregnancy Association: americanpregnancy.org [employer]necologists: acog.org Office on [employer] Health:  TravelLesson.ca Contact a health care provider if: You have a headache that does not go away when you take medicine. You have any of these problems: You can't eat or drink. You have nausea and vomiting. You have watery poop (diarrhea) for 2 days or more. You have pain when you pee, or your pee smells bad. You have been sick for 2 days or more and aren't getting better. Contact your provider right away if: You have any of these coming from your vagina: Abnormal discharge. Bad-smelling fluid. Bleeding. Your baby is moving less than usual. You have signs of labor: You have any contractions, belly cramping, or have pain in your pelvis or lower back before 37 weeks of pregnancy (preterm labor). You have regular contractions that are less than 5 minutes apart. Your water breaks. You have symptoms of high blood pressure or preeclampsia. These include: A severe, throbbing headache that does not go away. Sudden or extreme swelling of your face, hands, legs, or feet. Vision problems: You see spots. You have blurry vision. Your eyes are sensitive to light. If you can't reach your provider, go to an urgent care or emergency room. Get help right away if: You faint, become confused, or can't think clearly. You have chest pain or trouble breathing. You have any kind of injury, such as from a fall or a car crash. These symptoms may be an emergency. Call 911 right away. Do not wait to see if the symptoms will go away. Do not drive yourself to the hospital. This information is not intended to replace advice given to you by your health care provider. Make sure you discuss any questions you have with your health care provider. Document Revised: 11/20/2022 Document Reviewed: 06/20/2022 Elsevier Patient Education  2024 [employer].

## 2023-12-23 ENCOUNTER — Telehealth: Payer: Self-pay

## 2023-12-23 NOTE — Telephone Encounter (Signed)
 Patient called with concerns about her U/S being cancelled. Please call patient.

## 2023-12-24 ENCOUNTER — Encounter: Admitting: Skilled Nursing Facility1

## 2023-12-24 NOTE — Patient Instructions (Signed)
 Third Trimester of Pregnancy  The third trimester of pregnancy is from week 28 through week 40. This is months 7 through 9. The third trimester is a time when your baby is growing fast. Body changes during your third trimester Your body continues to change during this time. The changes usually go away after your baby is born. Physical changes You will continue to gain weight. You may get stretch marks on your hips, belly, and breasts. Your breasts will keep growing and may hurt. A yellow fluid (colostrum) may leak from your breasts. This is the first milk you're making for your baby. Your hair may grow faster and get thicker. In some cases, you may get hair loss. Your belly button may stick out. You may have more swelling in your hands, face, or ankles. Health changes You may have heartburn. You may feel short of breath. This is caused by the uterus that is now bigger. You may have more aches in the pelvis, back, or thighs. You may have more tingling or numbness in your hands, arms, and legs. You may pee more often. You may have trouble pooping (constipation) or swollen veins in the butt that can itch or get painful (hemorrhoids). Other changes You may have more problems sleeping. You may notice the baby moving lower in your belly (dropping). You may have more fluid coming from your vagina. Your joints may feel loose, and you may have pain around your pelvic bone. Follow these instructions at home: Medicines Take medicines only as told by your health care provider. Some medicines are not safe during pregnancy. Your provider may change the medicines that you take. Do not take any medicines unless told to by your provider. Take a prenatal vitamin that has at least 600 micrograms (mcg) of folic acid. Do not use herbal medicines, illegal drugs, or medicines that are not approved by your provider. Eating and drinking While you're pregnant your body needs additional nutrition to help  support your growing baby. Talk with your provider about your nutritional needs. Activity Most women are able to exercise regularly during pregnancy. Exercise routines may need to change at the end of your pregnancy. Talk to your provider about your activities and exercise routine. Relieving pain and discomfort Rest often with your legs raised if you have leg cramps or low back pain. Take warm sitz baths to soothe pain from hemorrhoids. Use hemorrhoid cream if your provider says it's okay. Wear a good, supportive bra if your breasts hurt. Do not use hot tubs, steam rooms, or saunas. Do not douche. Do not use tampons or scented pads. Safety Talk to your provider before traveling far distances. Wear your seatbelt at all times when you're in a car. Talk to your provider if someone hits you, hurts you, or yells at you. Preparing for birth To prepare for your baby: Take childbirth and breastfeeding classes. Visit the hospital and tour the maternity area. Buy a rear-facing car seat. Learn how to install it in your car. General instructions Avoid cat litter boxes and soil used by cats. These things carry germs that can cause harm to your pregnancy and your baby. Do not drink alcohol, smoke, vape, or use products with nicotine or tobacco in them. If you need help quitting, talk with your provider. Keep all follow-up visits for your third trimester. Your provider will do more exams and tests during this trimester. Write down your questions. Take them to your prenatal visits. Your provider also will: Talk with you about  your overall health. Give you advice or refer you to specialists who can help with different needs, including: Mental health and counseling. Foods and healthy eating. Ask for help if you need help with food. Where to find more information American Pregnancy Association: americanpregnancy.org [employer]etricians and Gynecologists: acog.org Office on [employer] Health:  [employer] Contact a health care provider if: You have a headache that does not go away when you take medicine. You have any of these problems: You can't eat or drink. You have nausea and vomiting. You have watery poop (diarrhea) for 2 days or more. You have pain when you pee, or your pee smells bad. You have been sick for 2 days or more and aren't getting better. Contact your provider right away if: You have any of these coming from your vagina: Abnormal discharge. Bad-smelling fluid. Bleeding. Your baby is moving less than usual. You have signs of labor: You have any contractions, belly cramping, or have pain in your pelvis or lower back before 37 weeks of pregnancy (preterm labor). You have regular contractions that are less than 5 minutes apart. Your water breaks. You have symptoms of high blood pressure or preeclampsia. These include: A severe, throbbing headache that does not go away. Sudden or extreme swelling of your face, hands, legs, or feet. Vision problems: You see spots. You have blurry vision. Your eyes are sensitive to light. If you can't reach your provider, go to an urgent care or emergency room. Get help right away if: You faint, become confused, or can't think clearly. You have chest pain or trouble breathing. You have any kind of injury, such as from a fall or a car crash. These symptoms may be an emergency. Call 911 right away. Do not wait to see if the symptoms will go away. Do not drive yourself to the hospital. This information is not intended to replace advice given to you by your health care provider. Make sure you discuss any questions you have with your health care provider. Document Revised: 11/20/2022 Document Reviewed: 06/20/2022 Elsevier Patient Education  2024 [employer]. Darol Irving Contractions: Self-Care Braxton Hicks contractions may feel like labor contractions, but they're like practice for real labor. They can start when you're  halfway through your pregnancy. During the last 2 months of your pregnancy, these contractions may happen more and hurt more. However, they aren't a sign that you're in real labor. What are Darol Irving contractions? Braxton Hicks contractions make your belly muscles tighten. They aren't real labor because they don't make your cervix, which is the lowest part of your uterus, open and thin. This tightening of your belly before labor starts can also be called false labor. How to tell the difference between true labor and false labor True labor contractions Last 60-90 seconds. Happen on a pattern. Get stronger and last longer over time. Don't go away when you: Walk. Change positions. Rest. Discomfort often begins in the back and then moves to the front. The cervix opens and thins. False labor contractions Are weak and last a short time. Don't happen on a pattern. Happen farther apart than true labor. May go away when you: Walk. Change positions. Rest. May be noticed more at the end of the day. Discomfort is often only felt in the front of the belly. The cervix usually does not open or thin. Sometimes, the only way to tell if you're in true labor is for your health care provider to check your cervix. Your provider will do  a physical exam and may monitor your contractions. If you're in true labor, your provider may send you home with instructions about when to return to the hospital or may send you to the hospital right away. Follow these instructions at home:  Take your medicines only as told. Drink more fluids as told. Dehydration may cause these contractions. Dehydration is when there's not enough water in your body. If Braxton Hicks contractions are making you uncomfortable: Change your position or activity. Sit and rest in a tub of warm water. Do slow and deep breathing several times an hour. Keep all follow-up visits. Your provider will need to check your health and your baby's  health. Contact a health care provider if: Your contractions become: Stronger. More regular. Closer together. You have mucus from your vagina that has blood in it. Get help right away if: You feel your baby moving less than usual. You have any amount of fluid that flows from your vagina without stopping. You have signs or symptoms of labor before 37 weeks of pregnancy, such as: Contractions that are 5 minutes or less apart, or that get stronger and last longer. Sudden, sharp pain in your belly or lower back. These symptoms may be an emergency. Call 911 right away. Do not wait to see if the symptoms will go away. Do not drive yourself to the hospital. This information is not intended to replace advice given to you by your health care provider. Make sure you discuss any questions you have with your health care provider. Document Revised: 09/30/2022 Document Reviewed: 09/30/2022 Elsevier Patient Education  2025 [employer]. Fetal Movement Counts When you're pregnant, you might start feeling your baby move around the middle of your pregnancy. At first, these movements might feel like flutters, rolls, or swishes. As your baby grows, you might feel more kicks and jabs. Around week 28 of your pregnancy, your health care team may ask you to count how often your baby moves. This is important for all pregnancies, but especially for high-risk ones. Counting movements can help lessen the risk of stillbirth. What is a fetal movement count? A fetal movement count is the number of times that you feel your baby move during a certain amount of time. This may also be called a kick count. There are many ways to do a kick count. Ask your team what is best for you. Pay attention to when your baby is most active. You may notice your baby's sleep and wake cycles. You may also notice things that make your baby move more. When you do a kick count, try to do it: When your baby is normally most active. At the same  time each day. How do I count fetal movements?  Find a quiet, comfortable area. Sit or lie down. Write down the date, the start time, and the number of movements you feel. Count kicks, flutters, swishes, rolls, and jabs. Usually, you will feel at least 10 movements within 2 hours. Stop counting after you have felt 10 movements or if you have been counting for 2 hours. Write down the stop time. Contact a health care provider if: You don't feel 10 movements in 2 hours. Your baby isn't moving as it usually does. Your baby isn't moving at all. If you're not able to reach your provider, go to an emergency room. This information is not intended to replace advice given to you by your health care provider. Make sure you discuss any questions you have with your health  care provider. Document Revised: 03/13/2023 Document Reviewed: 03/05/2022 Elsevier Patient Education  2025 [employer].

## 2023-12-24 NOTE — Progress Notes (Signed)
 NEW OB HISTORY AND PHYSICAL  SUBJECTIVE:       Brandi Lucero is a 38 y.o. [id number] female, Patient's last menstrual period was 05/12/2023 (approximate)., Estimated Date of Delivery: 02/16/24, [redacted]w[redacted]d, presents today for transfer of Prenatal Care. She has been receiving regular P[facility] from [facility], however they are no longer providing obstetric services after 11/30 and she must transfer her care. She reports her last ultrasound at 29w showed baby was measuring 2 weeks behind at 8% (ultrasound report not available in [facility] or faxed transfer records). She also has multiple fibroids, largest 14cm at last ultrasound, she has been told they should not keep her from having a vaginal birth but she is concerned about the consequences of them and whether they are impacting her baby's growth. Reports weight loss in pregnancy, she attributes this in part to stopping all psychiatric medications on finding out she was pregnant. Endorses good appetite, denies significant nausea/vomiting but needs to eat what sounds good when it sounds good.  Concerns: -MFM referral-was ordered by prior practice but no appointment made -Fibroids and route of birth -Nutrition referral was ordered by prior practice but unable to keep appointment, desires new referral   Social history Partner/Relationship: FOB involved, Rashawn Living situation: lives alone Work: Midwife for [employer], special needs students Exercise: not regular Substance use: denies T/E/D  Indications for ASA therapy (per uptodate)-Already taking One of the following: Previous pregnancy with preeclampsia, especially early onset and with an adverse outcome No Multifetal gestation No Chronic hypertension No Type 1 or 2 diabetes mellitus No Chronic kidney disease No Autoimmune disease (antiphospholipid syndrome, systemic lupus erythematosus) No  Two or more of the following: Nulliparity Yes Obesity (body mass index >30 kg/m2) No Family  history of preeclampsia in mother or sister No Age >=35 years Yes Sociodemographic characteristics (African American race, low socioeconomic level) Yes Personal risk factors (eg, previous pregnancy with low birth weight or small for gestational age infant, previous adverse pregnancy outcome [eg, stillbirth], interval >10 years between pregnancies) No    Gynecologic History Patient's last menstrual period was 05/12/2023 (approximate).  Obstetric History OB History  Gravida Para Term Preterm AB Living  4    3   SAB IAB Ectopic Multiple Live Births  2  1      # Outcome Date GA Lbr Len/2nd Weight Sex Type Anes PTL Lv  4 Current           3 SAB              Birth Comments: System Generated. Please review and update pregnancy details.  2 SAB           1 Ectopic             Past Medical History:  Diagnosis Date   Anxiety    Bipolar 1 disorder (HCC)    Cheekbone fracture (HCC)    Depression    Diabetes mellitus without complication (HCC)    failed 1 hour GD screening test (gestational DM)   Ectopic pregnancy    IBS (irritable bowel syndrome) 2009   Restless leg syndrome     Past Surgical History:  Procedure Laterality Date   DILATION AND EVACUATION  03/03/2012   ECTOPIC PREGNANCY SURGERY  03/03/2004   LAPAROSCOPY FOR ECTOPIC PREGNANCY  03/03/2004   WISDOM TOOTH EXTRACTION      Current Outpatient Medications on File Prior to Visit  Medication Sig Dispense Refill   aspirin EC 81 MG tablet Take 81  mg by mouth daily.     Prenatal Vit-Fe Fumarate-FA (PRENATAL PO) Take by mouth.     albuterol  (PROVENTIL ) (2.5 MG/3ML) 0.083% nebulizer solution Take 3 mLs (2.5 mg total) by nebulization every 6 (six) hours as needed for wheezing or shortness of breath. (Patient not taking: Reported on 12/28/2023) 75 mL 12   albuterol  (VENTOLIN  HFA) 108 (90 Base) MCG/ACT inhaler Inhale 1 puff into the lungs every 4 (four) hours as needed. (Patient not taking: Reported on 12/28/2023)     ALPRAZolam  (XANAX) 0.25 MG tablet Take 0.25 mg by mouth daily. (Patient not taking: Reported on 12/21/2023)     cyclobenzaprine  (FLEXERIL ) 5 MG tablet Take 5 mg by mouth at bedtime as needed. (Patient not taking: Reported on 12/28/2023)     escitalopram (LEXAPRO) 20 MG tablet Take 20 mg by mouth daily. (Patient not taking: Reported on 12/21/2023)     ondansetron  (ZOFRAN -ODT) 4 MG disintegrating tablet Take 1 tablet (4 mg total) by mouth every 8 (eight) hours as needed for nausea or vomiting. (Patient not taking: Reported on 12/28/2023) 15 tablet 0   Prenatal Vit-Fe Fumarate-FA (PNV FOLIC ACID + IRON PO) Take 1 capsule by mouth daily. (Patient not taking: Reported on 12/21/2023)     progesterone (PROMETRIUM) 200 MG capsule Take 200 mg by mouth at bedtime. (Patient not taking: Reported on 12/28/2023)     rOPINIRole (REQUIP) 1 MG tablet Take 1 mg by mouth at bedtime. (Patient not taking: Reported on 12/28/2023)     scopolamine  (TRANSDERM-SCOP) 1 MG/3DAYS Place 1 patch (1.5 mg total) onto the skin every 3 (three) days. (Patient not taking: Reported on 12/28/2023) 10 patch 12   simethicone  (MYLICON) 80 MG chewable tablet Chew 80 mg by mouth 4 (four) times daily - after meals and at bedtime. (Patient not taking: Reported on 12/28/2023)     No current facility-administered medications on file prior to visit.    Allergies  Allergen Reactions   Latex Itching and Other (See Comments)    Other Reaction(s): rash, itchy   Penicillin G Dermatitis    Other Reaction(s): rash, hives, stomach pain   Vicodin [Hydrocodone -Acetaminophen ]     Bad headache    Penicillins Rash    Social History   Socioeconomic History   Marital status: Single    Spouse name: Not on file   Number of children: Not on file   Years of education: Not on file   Highest education level: Not on file  Occupational History   Not on file  Tobacco Use   Smoking status: Former    Current packs/day: 0.50    Types: Cigarettes    Passive  exposure: Past   Smokeless tobacco: Never  Vaping Use   Vaping status: Never Used  Substance and Sexual Activity   Alcohol use: Not Currently    Alcohol/week: 2.0 standard drinks of alcohol    Types: 2 Standard drinks or equivalent per week   Drug use: No   Sexual activity: Not Currently    Birth control/protection: None  Other Topics Concern   Not on file  Social History Narrative   Single   Lives in one stories house with 2 persons, 4 dogs   Current profession: Customer Service Manager    Exercises 3 x monthly at gym    Social Drivers of Health   Financial Resource Strain: Low Risk  (12/21/2023)   Overall Financial Resource Strain (CARDIA)    Difficulty of Paying Living Expenses: Not hard at all  Food Insecurity:  No Food Insecurity (11/21/2023)   Received [facility]   Hunger Vital Sign    Within the past 12 months, you worried that your food would run out before you got the money to buy more.: Never true    Within the past 12 months, the food you bought just didn't last and you didn't have money to get more.: Never true  Transportation Needs: No Transportation Needs (11/21/2023)   Received from [facility] - [facility]    In the past 12 months, has lack of transportation kept you from medical appointments or from getting medications?: No    Lack of Transportation (Non-Medical): No  Physical Activity: Sufficiently Active (12/21/2023)   Exercise Vital Sign    Days of Exercise per Week: 7 days    Minutes of Exercise per Session: 30 min  Stress: Stress Concern Present (12/21/2023)   [employer] of Occupational Health - Occupational Stress Questionnaire    Feeling of Stress: To some extent  Social Connections: Socially Isolated (12/21/2023)   Social Connection and Isolation Panel    Frequency of Communication with Friends and Family: More than three times a week    Frequency of Social Gatherings with Friends and Family: Three times  a week    Attends Religious Services: Never    Active Member of Clubs or Organizations: No    Attends Banker Meetings: Never    Marital Status: Never married  Intimate Partner Violence: Not At Risk (12/21/2023)   Humiliation, Afraid, Rape, and Kick questionnaire    Fear of Current or Ex-Partner: No    Emotionally Abused: No    Physically Abused: No    Sexually Abused: No    Family History  Problem Relation Age of Onset   High blood pressure Mother    Colon cancer Father    Prostate cancer Father    Alzheimer's disease Maternal Grandmother    Emphysema Maternal Grandfather    Alzheimer's disease Paternal Grandmother    Prostate cancer Paternal Grandfather    Cervical cancer Neg Hx    Uterine cancer Neg Hx     The following portions of the patient's history were reviewed and updated as appropriate: allergies, current medications, past OB history, past medical history, past surgical history, past family history, past social history, and problem list.  Constitutional: Denied constitutional symptoms, night sweats, recent illness, fatigue, fever, insomnia and weight loss.  Eyes: Denied eye symptoms, eye pain, photophobia, vision change and visual disturbance.  Ears/Nose/Throat/Neck: Denied ear, nose, throat or neck symptoms, hearing loss, nasal discharge, sinus congestion and sore throat.  Cardiovascular: Denied cardiovascular symptoms, arrhythmia, chest pain/pressure, edema, exercise intolerance, orthopnea and palpitations.  Respiratory: Denied pulmonary symptoms, asthma, pleuritic pain, productive sputum, cough, dyspnea and wheezing.  Gastrointestinal: Denied gastro-esophageal reflux, melena, nausea and vomiting.  Genitourinary: Denied genitourinary symptoms including symptomatic vaginal discharge, pelvic relaxation issues, and urinary complaints.  Musculoskeletal: Denied musculoskeletal symptoms, stiffness, swelling, muscle weakness and myalgia.  Dermatologic: Denied  dermatology symptoms, rash and scar.  Neurologic: Denied neurology symptoms, dizziness, headache, neck pain and syncope.  Psychiatric: Denied psychiatric symptoms, anxiety and depression.  Endocrine: Denied endocrine symptoms including hot flashes and night sweats.     OBJECTIVE: Initial Physical Exam (New OB) BP 100/71   Pulse 89   Wt 159 lb 12.8 oz (72.5 kg)   LMP 05/12/2023 (Approximate)   BMI 31.21 kg/m  Physical Exam Vitals reviewed.  Constitutional:      Appearance:  Normal appearance.  Cardiovascular:     Rate and Rhythm: Normal rate and regular rhythm.  Pulmonary:     Effort: Pulmonary effort is normal.     Breath sounds: Normal breath sounds.  Skin:    General: Skin is warm and dry.  Neurological:     General: No focal deficit present.     Mental Status: She is alert and oriented to person, place, and time.     Fetal Heart Rate (bpm): 135  ASSESSMENT: High risk pregnancy due to fetal growth restriction   PLAN: Ultrasound today for growth & UA dopplers. Urgent MFM referral placed, Dr. Ileana & MFM scheduler messaged to facilitate Nutrition referral ordered Next visit with Dr. Leigh to discuss delivery planning given size of fibroids.  1. Supervision of other normal pregnancy, antepartum (Primary)  2. [redacted] weeks gestation of pregnancy  3. Primigravida of advanced maternal age in third trimester - US  OB Comp + 14 Wk  4. 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 Fetal growth restriction antepartum - US  OB Comp + 14 Wk - US  UA Cord Doppler  6. Insufficient weight gain during pregnancy in third trimester - Referral to Nutrition and Diabetes Services   Harlene LITTIE Cisco, CNM

## 2023-12-28 ENCOUNTER — Ambulatory Visit (INDEPENDENT_AMBULATORY_CARE_PROVIDER_SITE_OTHER): Admitting: Certified Nurse Midwife

## 2023-12-28 ENCOUNTER — Other Ambulatory Visit: Payer: Self-pay | Admitting: Family Medicine

## 2023-12-28 ENCOUNTER — Other Ambulatory Visit: Payer: Self-pay | Admitting: Certified Nurse Midwife

## 2023-12-28 ENCOUNTER — Ambulatory Visit

## 2023-12-28 ENCOUNTER — Other Ambulatory Visit (INDEPENDENT_AMBULATORY_CARE_PROVIDER_SITE_OTHER)

## 2023-12-28 VITALS — BP 100/71 | HR 89 | Wt 159.8 lb

## 2023-12-28 DIAGNOSIS — O26843 Uterine size-date discrepancy, third trimester: Secondary | ICD-10-CM

## 2023-12-28 DIAGNOSIS — O365930 Maternal care for other known or suspected poor fetal growth, third trimester, not applicable or unspecified: Secondary | ICD-10-CM

## 2023-12-28 DIAGNOSIS — Z3480 Encounter for supervision of other normal pregnancy, unspecified trimester: Secondary | ICD-10-CM

## 2023-12-28 DIAGNOSIS — O09513 Supervision of elderly primigravida, third trimester: Secondary | ICD-10-CM | POA: Diagnosis not present

## 2023-12-28 DIAGNOSIS — O365990 Maternal care for other known or suspected poor fetal growth, unspecified trimester, not applicable or unspecified: Secondary | ICD-10-CM

## 2023-12-28 DIAGNOSIS — Z3A32 32 weeks gestation of pregnancy: Secondary | ICD-10-CM

## 2023-12-28 DIAGNOSIS — D259 Leiomyoma of uterus, unspecified: Secondary | ICD-10-CM

## 2023-12-28 DIAGNOSIS — Z3A31 31 weeks gestation of pregnancy: Secondary | ICD-10-CM | POA: Diagnosis not present

## 2023-12-28 DIAGNOSIS — O3413 Maternal care for benign tumor of corpus uteri, third trimester: Secondary | ICD-10-CM

## 2023-12-28 DIAGNOSIS — O2613 Low weight gain in pregnancy, third trimester: Secondary | ICD-10-CM

## 2023-12-29 ENCOUNTER — Other Ambulatory Visit: Payer: Self-pay

## 2023-12-29 ENCOUNTER — Other Ambulatory Visit: Payer: Self-pay | Admitting: Certified Nurse Midwife

## 2023-12-29 ENCOUNTER — Telehealth: Payer: Self-pay

## 2023-12-29 DIAGNOSIS — Z3A32 32 weeks gestation of pregnancy: Secondary | ICD-10-CM

## 2023-12-29 DIAGNOSIS — Z3480 Encounter for supervision of other normal pregnancy, unspecified trimester: Secondary | ICD-10-CM

## 2023-12-29 DIAGNOSIS — O365990 Maternal care for other known or suspected poor fetal growth, unspecified trimester, not applicable or unspecified: Secondary | ICD-10-CM

## 2023-12-29 DIAGNOSIS — O09513 Supervision of elderly primigravida, third trimester: Secondary | ICD-10-CM

## 2023-12-29 DIAGNOSIS — O2613 Low weight gain in pregnancy, third trimester: Secondary | ICD-10-CM

## 2023-12-29 DIAGNOSIS — D259 Leiomyoma of uterus, unspecified: Secondary | ICD-10-CM

## 2023-12-29 NOTE — Telephone Encounter (Signed)
 Patient calling in regarding her ultrasound from yesterday. She is very worried due to MFM not having availably for scheduling and would like to know if she needs to be referred to another MFM or what is the best next step for her.

## 2023-12-30 ENCOUNTER — Other Ambulatory Visit: Payer: Self-pay | Admitting: *Deleted

## 2023-12-30 ENCOUNTER — Ambulatory Visit (HOSPITAL_COMMUNITY): Admission: RE | Admit: 2023-12-30

## 2023-12-30 ENCOUNTER — Ambulatory Visit (HOSPITAL_BASED_OUTPATIENT_CLINIC_OR_DEPARTMENT_OTHER): Admitting: Maternal & Fetal Medicine

## 2023-12-30 ENCOUNTER — Ambulatory Visit: Attending: Obstetrics and Gynecology

## 2023-12-30 ENCOUNTER — Other Ambulatory Visit: Payer: Self-pay | Admitting: Certified Nurse Midwife

## 2023-12-30 VITALS — BP 124/81 | HR 80

## 2023-12-30 DIAGNOSIS — D259 Leiomyoma of uterus, unspecified: Secondary | ICD-10-CM | POA: Diagnosis not present

## 2023-12-30 DIAGNOSIS — O365930 Maternal care for other known or suspected poor fetal growth, third trimester, not applicable or unspecified: Secondary | ICD-10-CM | POA: Insufficient documentation

## 2023-12-30 DIAGNOSIS — O365990 Maternal care for other known or suspected poor fetal growth, unspecified trimester, not applicable or unspecified: Secondary | ICD-10-CM | POA: Insufficient documentation

## 2023-12-30 DIAGNOSIS — O3413 Maternal care for benign tumor of corpus uteri, third trimester: Secondary | ICD-10-CM

## 2023-12-30 DIAGNOSIS — O09523 Supervision of elderly multigravida, third trimester: Secondary | ICD-10-CM

## 2023-12-30 DIAGNOSIS — Z3A33 33 weeks gestation of pregnancy: Secondary | ICD-10-CM

## 2023-12-30 DIAGNOSIS — O99213 Obesity complicating pregnancy, third trimester: Secondary | ICD-10-CM

## 2023-12-30 NOTE — Progress Notes (Signed)
 Patient information  Patient Name: Brandi Lucero  Patient MRN:   [id number]  Referring practice: MFM Referring Provider: Belle SHIPPER  Problem List   Patient Active Problem List   Diagnosis Date Noted   Intrauterine growth restriction (IUGR) affecting care of mother, third trimester, single gestation 12/30/2023   Supervision of high-risk pregnancy, third trimester 12/21/2023   Allergic rhinitis 03/01/2014   Bipolar 2 disorder (HCC) 02/10/2014   Anxiety 02/10/2014   Maternal Fetal medicine Consult  Brandi Lucero is a 38 y.o. G4P0030 at [redacted]w[redacted]d here for ultrasound and consultation. Shizuye C Leinbach is doing well today with no acute concerns. Today we focused on the following:   Small fetal head size: I discussed this finding with the patient.  Currently the head circumference is within 1-2 standard deviations.  This does not qualify as microcephaly until the fetal head circumference is at least 2 but more likely 3-4 standard deviations below normal.  The current measurements likely represent constitutional growth or are a manifestation of growth restriction.  Fetal growth restriction  I discussed the diagnosis, management and prognosis of fetal growth restriction (FGR) with the patient today. I discussed the various causes of growth restriction including constitutionally small fetus, placental insufficiency, genetic problems and chronic maternal disease. Currently there is no evidence of sonographic stigmata suggesting infection or aneuploidy.  I discussed the most likely cause of her fetal growth restriction is either a constitutionally small fetus or placental insufficiency. We discussed it is often challenging to differentiate between a fetus that is constitutionally small but is fulfilling its growth potential and a fetus that is not fulfilling its growth potential because of an underlying pathologic condition. Approximately 70% of fetuses with birth weight below the 10th percentile for  gestational age are constitutionally small; in the remaining 30%, the cause of the small size is pathologic, meeting intrauterine growth restriction definition.  However, the smaller the fetus in the earlier onset of growth restriction the more likely there is a pathologic process causing the growth restriction.  We also discussed the potential for genetic or infectious causes but this is unlikely.  The patient had time to ask questions that were answered to her satisfaction.  She verbalized understanding and agrees to proceed with the plan below.  Sonographic findings Single intrauterine pregnancy at 33w 1d. Fetal cardiac activity:  Observed and appears normal. Presentation: Cephalic. Interval fetal anatomy appears normal. Fetal biometry shows the estimated fetal weight at the 5 percentile and the abdominal circumference at the 20 percentile.  Amniotic fluid: Within normal limits.  MVP: 4.72 cm. Placenta: Posterior. Umbilical artery dopplers findings: -S/D:2.7 which are normal at this gestational age.  -Absent end-diastolic flow: No.  -Reversed end-diastolic flow:  No. Overall BPP: 8/8.  Recommendations - Continue UA dopplers and antenatal testing as scheduled. - Serial growth US  every 3 weeks until delivery. - Delivery likely around 37 weeks or sooner if indicated.   - If there is concern for fetal microcephaly or extreme FGR at the time of birth then urinary CMV test can be done to assess this in the fetus.  Review of Systems: A review of systems was performed and was negative except per HPI   Vitals and Physical Exam    12/30/2023   11:00 AM 12/28/2023    1:20 PM 12/20/2023    5:12 AM  Vitals with BMI  Weight  159 lbs 13 oz   BMI  31.21   Systolic 124 100 875  Diastolic 81 71  81  Pulse 80 89 100    Sitting comfortably on the sonogram table Nonlabored breathing Normal rate and rhythm Abdomen is nontender  Past pregnancies OB History  Gravida Para Term Preterm AB  Living  4    3   SAB IAB Ectopic Multiple Live Births  2  1      # Outcome Date GA Lbr Len/2nd Weight Sex Type Anes PTL Lv  4 Current           3 SAB              Birth Comments: System Generated. Please review and update pregnancy details.  2 SAB           1 Ectopic              I spent 60 minutes reviewing the patients chart, including labs and images as well as counseling the patient about her medical conditions. Greater than 50% of the time was spent in direct face-to-face patient counseling.  [name]  MFM, [place]   12/30/2023  1:48 PM

## 2024-01-07 ENCOUNTER — Telehealth: Payer: Self-pay

## 2024-01-07 NOTE — Telephone Encounter (Signed)
 TRIAGE VOICEMAIL: Patient states she had to call out of work today due to legs retaining fluid and she has had a headache for three days now. Inquiring if she needs to be seen.

## 2024-01-07 NOTE — Telephone Encounter (Signed)
 Spoke with patient. She has not taken anything for her headache. She denies upper gastric pain. She does not have a way to check her BP at home. She will go to pharmacy to have it checked and contact us  with the reading. Advised of abnormal reading anything >140/90. Advised can take two Tylenol  ES and drink a can of coke or cup of coffee. If this doesn't relieve it within one hour, she can take a benadryl. If BP elevated, will need to come to office for [facility].

## 2024-01-08 ENCOUNTER — Other Ambulatory Visit: Payer: Self-pay | Admitting: *Deleted

## 2024-01-08 ENCOUNTER — Ambulatory Visit

## 2024-01-08 ENCOUNTER — Ambulatory Visit: Attending: Maternal & Fetal Medicine

## 2024-01-08 VITALS — BP 119/75 | HR 79

## 2024-01-08 DIAGNOSIS — O09523 Supervision of elderly multigravida, third trimester: Secondary | ICD-10-CM | POA: Diagnosis present

## 2024-01-08 DIAGNOSIS — O365930 Maternal care for other known or suspected poor fetal growth, third trimester, not applicable or unspecified: Secondary | ICD-10-CM | POA: Insufficient documentation

## 2024-01-08 DIAGNOSIS — O3413 Maternal care for benign tumor of corpus uteri, third trimester: Secondary | ICD-10-CM | POA: Insufficient documentation

## 2024-01-08 DIAGNOSIS — D259 Leiomyoma of uterus, unspecified: Secondary | ICD-10-CM | POA: Diagnosis present

## 2024-01-08 DIAGNOSIS — O0993 Supervision of high risk pregnancy, unspecified, third trimester: Secondary | ICD-10-CM | POA: Insufficient documentation

## 2024-01-08 DIAGNOSIS — Z3A34 34 weeks gestation of pregnancy: Secondary | ICD-10-CM

## 2024-01-08 DIAGNOSIS — O99213 Obesity complicating pregnancy, third trimester: Secondary | ICD-10-CM | POA: Diagnosis present

## 2024-01-08 DIAGNOSIS — O365990 Maternal care for other known or suspected poor fetal growth, unspecified trimester, not applicable or unspecified: Secondary | ICD-10-CM | POA: Diagnosis present

## 2024-01-08 NOTE — Progress Notes (Signed)
   Patient information  Patient Name: Brandi Lucero  Patient MRN:   [id number]  Referring practice: MFM Referring Provider: Belle SHIPPER  Problem List   Patient Active Problem List   Diagnosis Date Noted   Intrauterine growth restriction (IUGR) affecting care of mother, third trimester, single gestation 12/30/2023   Supervision of high-risk pregnancy, third trimester 12/21/2023   Allergic rhinitis 03/01/2014   Bipolar 2 disorder (HCC) 02/10/2014   Anxiety 02/10/2014    Maternal Fetal medicine Consult  Brandi Lucero is a 38 y.o. G4P0030 at [redacted]w[redacted]d here for ultrasound and consultation. Brandi Lucero is doing well today with no acute concerns. Today we focused on the following:   The patient is here for FGR which showed the fetal growth at the 5th percentile overall in the 20th percentile abdominal circumference on the last ultrasound on 12/30/2023.  The umbilical artery Dopplers today are normal.  BPP is 8 out of 8.  Amniotic fluid is also normal.  The patient reports good fetal movement.  I discussed that a growth ultrasound will be done next week along with umbilical artery Dopplers and BPP.  Depending upon the findings of that ultrasound we will further risk stratify her delivery timing but it would likely be around 38 weeks if it continues in the same pattern.  Fetal movement precautions given.  The patient had time to ask questions that were answered to her satisfaction.  She verbalized understanding and agrees to proceed with the plan below.  Recommendations - Weekly umbilical artery Dopplers and BPP as long as the fetus remains growth restricted.  Growth every 3 weeks.  Delivery timing pending clinical course but likely around 38 weeks.  Review of Systems: A review of systems was performed and was negative except per HPI   Vitals and Physical Exam    01/08/2024   10:10 AM 12/30/2023   11:00 AM 12/28/2023    1:20 PM  Vitals with BMI  Weight   159 lbs 13 oz  BMI   31.21   Systolic 119 124 899  Diastolic 75 81 71  Pulse 79 80 89    Sitting comfortably on the sonogram table Nonlabored breathing Normal rate and rhythm Abdomen is nontender  Past pregnancies OB History  Gravida Para Term Preterm AB Living  4    3   SAB IAB Ectopic Multiple Live Births  2  1      # Outcome Date GA Lbr Len/2nd Weight Sex Type Anes PTL Lv  4 Current           3 SAB              Birth Comments: System Generated. Please review and update pregnancy details.  2 SAB           1 Ectopic              I spent 30 minutes reviewing the patients chart, including labs and images as well as counseling the patient about her medical conditions. Greater than 50% of the time was spent in direct face-to-face patient counseling.  Brandi Lucero  MFM, [facility]   01/08/2024  10:34 AM

## 2024-01-13 ENCOUNTER — Encounter: Admitting: Obstetrics

## 2024-01-15 ENCOUNTER — Ambulatory Visit

## 2024-01-15 ENCOUNTER — Ambulatory Visit: Attending: Maternal & Fetal Medicine

## 2024-01-15 VITALS — BP 123/71 | HR 80

## 2024-01-15 DIAGNOSIS — O0993 Supervision of high risk pregnancy, unspecified, third trimester: Secondary | ICD-10-CM | POA: Diagnosis present

## 2024-01-15 DIAGNOSIS — D259 Leiomyoma of uterus, unspecified: Secondary | ICD-10-CM | POA: Diagnosis present

## 2024-01-15 DIAGNOSIS — O365930 Maternal care for other known or suspected poor fetal growth, third trimester, not applicable or unspecified: Secondary | ICD-10-CM | POA: Insufficient documentation

## 2024-01-15 DIAGNOSIS — Z3A35 35 weeks gestation of pregnancy: Secondary | ICD-10-CM | POA: Diagnosis not present

## 2024-01-15 DIAGNOSIS — O09523 Supervision of elderly multigravida, third trimester: Secondary | ICD-10-CM | POA: Diagnosis present

## 2024-01-15 DIAGNOSIS — O3413 Maternal care for benign tumor of corpus uteri, third trimester: Secondary | ICD-10-CM | POA: Diagnosis present

## 2024-01-15 DIAGNOSIS — O365990 Maternal care for other known or suspected poor fetal growth, unspecified trimester, not applicable or unspecified: Secondary | ICD-10-CM | POA: Diagnosis present

## 2024-01-15 DIAGNOSIS — E669 Obesity, unspecified: Secondary | ICD-10-CM | POA: Diagnosis not present

## 2024-01-15 DIAGNOSIS — O99213 Obesity complicating pregnancy, third trimester: Secondary | ICD-10-CM | POA: Diagnosis present

## 2024-01-15 NOTE — Progress Notes (Signed)
   Patient information  Patient Name: Brandi Lucero  Patient MRN:   [id number]  Referring practice: MFM Referring Provider: Belle SHIPPER  Problem List   Patient Active Problem List   Diagnosis Date Noted   Intrauterine growth restriction (IUGR) affecting care of mother, third trimester, single gestation 12/30/2023   Supervision of high-risk pregnancy, third trimester 12/21/2023   Allergic rhinitis 03/01/2014   Bipolar 2 disorder (HCC) 02/10/2014   Anxiety 02/10/2014    Maternal Fetal medicine Consult  Brandi Lucero is a 38 y.o. G4P0030 at [redacted]w[redacted]d here for ultrasound and consultation. Brandi Lucero is doing well today with no acute concerns. Today we focused on the following:   FGR: Patient is here for BPP and Dopplers which are normal.  She reports good fetal movement.  She has no other concerns at this time.  Next week we will do growth in addition to the antenatal testing and umbilical artery Dopplers.  This will help provide timing of delivery recommendations.  The patient had time to ask questions that were answered to her satisfaction.  She verbalized understanding and agrees to proceed with the plan below.  Recommendations - BPP, growth and Dopplers next week.  Will determine timing of delivery next week. - Standard OB precautions given  Review of Systems: A review of systems was performed and was negative except per HPI   Vitals and Physical Exam    01/15/2024   12:42 PM 01/08/2024   10:10 AM 12/30/2023   11:00 AM  Vitals with BMI  Systolic 123 119 875  Diastolic 71 75 81  Pulse 80 79 80   Sitting comfortably on the sonogram table Nonlabored breathing Normal rate and rhythm Abdomen is nontender  Past pregnancies OB History  Gravida Para Term Preterm AB Living  4    3   SAB IAB Ectopic Multiple Live Births  2  1      # Outcome Date GA Lbr Len/2nd Weight Sex Type Anes PTL Lv  4 Current           3 SAB              Birth Comments: System Generated. Please  review and update pregnancy details.  2 SAB           1 Ectopic              I spent 20 minutes reviewing the patients chart, including labs and images as well as counseling the patient about her medical conditions. Greater than 50% of the time was spent in direct face-to-face patient counseling.  [name]  MFM, [facility]   01/15/2024  12:46 PM

## 2024-01-21 NOTE — Progress Notes (Signed)
    Return Prenatal Note   Subjective   38 y.o. G4P0030 at [redacted]w[redacted]d presents for this follow-up prenatal visit.  Patient reports feeling more pressure vaginally, BH contractions-not painful. 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 Has had to cancel/reschedule appointments due to her schedule as well. Baby shower tomorrow! MFM u/s Monday for growth. Patient reports: Movement: Present Contractions: Not present  Objective   Flow sheet Vitals: Pulse Rate: 84 BP: 108/74 Total weight gain: -21 lb (-9.526 kg)  General Appearance  No acute distress, well appearing, and well nourished Pulmonary   Normal work of breathing Neurologic   Alert and oriented to person, place, and time Psychiatric   Mood and affect within normal limits  Fetus A Non-Stress Test Interpretation for 01/22/24  Indication: IUGR  Fetal Heart Rate A Mode: External Baseline Rate (A): 135 bpm Variability: Moderate Accelerations: 15 x 15 Decelerations: None Multiple birth?: No  Uterine Activity Mode: Toco, Palpation Contraction Frequency (min): 2-5 Contraction Duration (sec): 60 Contraction Quality: Mild Resting Tone Palpated: Relaxed Resting Time: Adequate  Interpretation (Fetal Testing) Nonstress Test Interpretation: Reactive Overall Impression: Reassuring for gestational age    Assessment/Plan   Plan  38 y.o. G4P0030 at [redacted]w[redacted]d presents for follow-up OB visit. Reviewed prenatal record including previous visit note.  Supervision of high-risk pregnancy, third trimester Reviewed labor warning signs and expectations for birth. Instructed to call office or come to hospital with persistent headache, vision changes, regular contractions, leaking of fluid, decreased fetal movement or vaginal bleeding.  Intrauterine growth restriction (IUGR) affecting care of mother, third trimester, single gestation RNST today, preliminary u/s  report with normal dopplers. Follow up as scheduled with MFM for growth, dopplers Monday 11/24. Timing of delivery will be determined by this ultrasound, Domonic hopes to make it past Thanksgiving.      Orders Placed This Encounter  Procedures   Strep Gp B Culture+Rflx   US  UA Cord Doppler    Standing Status:   Future    Number of Occurrences:   1    Expiration Date:   01/21/2025    Reason for Exam (SYMPTOM  OR DIAGNOSIS REQUIRED):   IUG    Preferred Imaging Location?:   Internal   Respiratory syncytial virus vaccine, preF, subunit, bivalent,(Abrysvo)   Fetal nonstress test   No follow-ups on file.   Future Appointments  Date Time Provider Department Center  01/25/2024 11:15 AM [facility] PROVIDER 1 [facility]  01/25/2024 11:30 AM [facility] US6 [facility]    For next visit:  ROB with NST     Harlene LITTIE Cisco, CNM  01/21/2510:50 AM

## 2024-01-22 ENCOUNTER — Other Ambulatory Visit (HOSPITAL_COMMUNITY)
Admission: RE | Admit: 2024-01-22 | Discharge: 2024-01-22 | Disposition: A | Source: Ambulatory Visit | Attending: Certified Nurse Midwife | Admitting: Certified Nurse Midwife

## 2024-01-22 ENCOUNTER — Ambulatory Visit (INDEPENDENT_AMBULATORY_CARE_PROVIDER_SITE_OTHER): Admitting: Certified Nurse Midwife

## 2024-01-22 ENCOUNTER — Ambulatory Visit

## 2024-01-22 ENCOUNTER — Encounter: Admitting: Certified Nurse Midwife

## 2024-01-22 ENCOUNTER — Other Ambulatory Visit

## 2024-01-22 VITALS — BP 108/74 | HR 84 | Wt 159.0 lb

## 2024-01-22 DIAGNOSIS — Z3685 Encounter for antenatal screening for Streptococcus B: Secondary | ICD-10-CM

## 2024-01-22 DIAGNOSIS — Z3A36 36 weeks gestation of pregnancy: Secondary | ICD-10-CM | POA: Insufficient documentation

## 2024-01-22 DIAGNOSIS — Z113 Encounter for screening for infections with a predominantly sexual mode of transmission: Secondary | ICD-10-CM | POA: Insufficient documentation

## 2024-01-22 DIAGNOSIS — O0993 Supervision of high risk pregnancy, unspecified, third trimester: Secondary | ICD-10-CM

## 2024-01-22 DIAGNOSIS — Z23 Encounter for immunization: Secondary | ICD-10-CM

## 2024-01-22 DIAGNOSIS — Z2911 Encounter for prophylactic immunotherapy for respiratory syncytial virus (RSV): Secondary | ICD-10-CM

## 2024-01-22 DIAGNOSIS — O365930 Maternal care for other known or suspected poor fetal growth, third trimester, not applicable or unspecified: Secondary | ICD-10-CM

## 2024-01-22 NOTE — Assessment & Plan Note (Signed)
 Reviewed labor warning signs and expectations for birth. Instructed to call office or come to hospital with persistent headache, vision changes, regular contractions, leaking of fluid, decreased fetal movement or vaginal bleeding.

## 2024-01-22 NOTE — Patient Instructions (Addendum)
 RSV Vaccine: What You Need to Know Many vaccine information statements are available in Spanish and other languages. See classthemes.se. 1. Why get vaccinated? RSV vaccine can prevent lower respiratory tract disease caused by respiratory syncytial virus (RSV). RSV is a common respiratory virus that usually causes mild, cold-like symptoms. RSV can cause illness in people of all ages but may be especially serious for infants and older adults. RSV is the most common cause of hospitalization in [place] infants. Infants up to 71 months of age (especially those 6 months and younger) and children who were born prematurely, or who have chronic lung or heart disease, or a weakened immune system, are at increased risk of severe RSV disease. RSV infections can be dangerous for certain adults. Adults at highest risk for severe RSV disease include older adults, especially those with chronic heart or lung disease, a weakened immune system, certain other chronic medical conditions, or who live in nursing homes. RSV spreads through direct contact with the virus, such as when droplets from an infected person's cough or sneeze contact your eyes, nose, or mouth. It can also be spread by someone touching a surface, such as a doorknob, that has the virus on it, and then touching your face. Symptoms of RSV infection may include runny nose, decreased appetite, coughing, sneezing, fever, or wheezing. In very young infants, symptoms of RSV may also include irritability (fussiness), decreased activity, or apnea (pauses in breathing for more than 10 seconds). Most people recover in a week or two, but RSV can be more serious, resulting in shortness of breath and low oxygen levels. RSV can cause bronchiolitis (inflammation of the small airways in the lung) and pneumonia (infection of the lungs). RSV can also lead to worsening of other medical conditions such as asthma, chronic obstructive pulmonary disease (a chronic disease of the  lungs that makes it hard to breathe), or heart failure (when the heart cannot pump enough blood and oxygen throughout the body). Infants and older adults who get very sick from RSV may need to be hospitalized. Some may even die. 2. RSV vaccine There are two immunization options available for protecting infants against RSV: maternal vaccine for the pregnant person or preventive antibodies given to the baby. Only one of these options is needed for most babies to be protected. CDC recommends a one-time dose of RSV vaccine for pregnant people from week 32 through week 36 of pregnancy for the prevention of RSV disease in their infants during the first 6 months of life. This vaccine is recommended to be given from September through January for most of the [place] . However, in some locations (for example, the territories, [place] , [place] , and parts of [place] ), the timing of vaccination may differ based on the time of year when RSV circulates in the area. CDC recommends a one-time-dose of RSV vaccine for everyone 75 years and older and for adults 8 through 38 years of age who are at increased risk of severe RSV disease. Adults 42 through 38 years old who are at increased risk include those with chronic heart or lung disease, a weakened immune system, or certain other chronic medical conditions, and those who are residents of nursing homes. RSV vaccine may be given at the same time as other vaccines. 3. Talk with your health care provider Tell your vaccination provider if the person getting the vaccine: Has had an allergic reaction after a previous dose of RSV vaccine, or has any severe, life-threatening allergies In some cases, your health  care provider may decide to postpone RSV vaccination until a future visit.  People with minor illnesses, such as a cold, may be vaccinated. People who are moderately or severely ill should usually wait until they recover before getting RSV vaccine.  Your health care  provider can give you more information. 4. Risks of a vaccine reaction Pain, redness, and swelling where the shot is given, fatigue (feeling tired), fever, headache, nausea, diarrhea, and muscle or joint pain can happen after RSV vaccination. Serious neurologic conditions, including Guillain-Barr syndrome (GBS), have been reported after RSV vaccination in some older adults. At this time, an increased risk of GBS following RSV vaccine among persons aged 64 years and older cannot be confirmed or ruled out. Preterm birth and high blood pressure during pregnancy, including pre-eclampsia, have been reported among pregnant people who received RSV vaccine. It is unclear whether these events were caused by the vaccine. People sometimes faint after medical procedures, including vaccination. Tell your provider if you feel dizzy or have vision changes or ringing in the ears.  As with any medicine, there is a very remote chance of a vaccine causing a severe allergic reaction, other serious injury, or death. V-Safe is a safety monitoring system that lets you share with CDC how you, or your dependent, feel after getting RSV vaccine. You can find information and enroll in V-Safe radarlocations.no. 5. What if there is a serious problem? An allergic reaction could occur after the vaccinated person leaves the clinic. If you see signs of a severe allergic reaction (hives, swelling of the face and throat, difficulty breathing, a fast heartbeat, dizziness, or weakness), call 9-1-1 and get the person to the nearest hospital. For other signs that concern you, call your health care provider.  Adverse reactions should be reported to the Vaccine Adverse Event Reporting System (VAERS). Your health care provider will usually file this report, or you can do it yourself. Visit the VAERS website at www.vaers.lagents.no or call 289-228-0474. VAERS is only for reporting reactions, and VAERS staff members do not give medical advice. 6. How  can I learn more? Ask your health care provider. Call your local or state health department. Visit the website of the Food and Drug Administration (FDA) for vaccine package inserts and additional information at finderlist.no Contact the Centers for Disease Control and Prevention (CDC): Call 218-841-1017 (1-800-CDC-INFO) or Visit CDC's vaccine website at piccapture.uy Source: CDC Vaccine Information Statement RSV (Respiratory Syncytial Virus) Vaccine (12/18/2022) This same material is available at tonerpromos.no for no charge. This information is not intended to replace advice given to you by your health care provider. Make sure you discuss any questions you have with your health care provider. Document Revised: 01/13/2023 Document Reviewed: 03/05/2022 Elsevier Patient Education  2024 Elsevier Inc.  Signs and Symptoms of Labor Labor is the body's natural process of moving the baby and the placenta out of the uterus. The process of labor usually starts when the baby is full-term, between 68 and 41 weeks of pregnancy. Signs and symptoms that you are close to going into labor As your body prepares for labor and the birth of your baby, you may notice the following symptoms in the weeks and days before true labor starts: Passing a small amount of thick, bloody mucus from your vagina. This is called normal bloody show or losing your mucus plug. This may happen more than a week before labor begins, or right before labor begins, as the opening of the cervix starts to widen (dilate). For  some women, the entire mucus plug passes at once. For others, pieces of the mucus plug may gradually pass over several days. Your baby moving (dropping) lower in your pelvis to get into position for birth (lightening). When this happens, you may feel more pressure on your bladder and pelvic bone and less pressure on your ribs. This may make it easier to breathe. It may also cause you to  need to urinate more often and have problems with bowel movements. Having practice contractions, also called Braxton Hicks contractions or false labor. These occur at irregular (unevenly spaced) intervals that are more than 10 minutes apart. False labor contractions are common after exercise or sexual activity. They will stop if you change position, rest, or drink fluids. These contractions are usually mild and do not get stronger over time. They may feel like: A backache or back pain. Mild cramps, similar to menstrual cramps. Tightening or pressure in your abdomen. Other early symptoms include: Nausea or loss of appetite. Diarrhea. Having a sudden burst of energy, or feeling very tired. Mood changes. Having trouble sleeping. Signs and symptoms that labor has begun Signs that you are in labor may include: Having contractions that come at regular (evenly spaced) intervals and increase in intensity. This may feel like more intense tightening or pressure in your abdomen that moves to your back. Contractions may also feel like rhythmic pain in your upper thighs or back that comes and goes at regular intervals. If you are delivering for the first time, this change in intensity of contractions often occurs at a more gradual pace. If you have given birth before, you may notice a more rapid progression of contraction changes. Feeling pressure in the vaginal area. Your water breaking (rupture of membranes). This is when the sac of fluid that surrounds your baby breaks. Fluid leaking from your vagina may be clear or blood-tinged. Labor usually starts within 24 hours of your water breaking, but it may take longer to begin. Some people may feel a sudden gush of fluid; others may notice repeatedly damp underwear. Follow these instructions at home:  When labor starts, or if your water breaks, call your health care provider or nurse care line. Based on your situation, they will determine when you should go  in for an exam. During early labor, you may be able to rest and manage symptoms at home. Some strategies to try at home include: Breathing and relaxation techniques. Taking a warm bath or shower. Listening to music. Using a heating pad on the lower back for pain. If directed, apply heat to the area as often as told by your health care provider. Use the heat source that your health care provider recommends, such as a moist heat pack or a heating pad. Place a towel between your skin and the heat source. Leave the heat on for 20-30 minutes. Remove the heat if your skin turns bright red. This is especially important if you are unable to feel pain, heat, or cold. You have a greater risk of getting burned. Contact a health care provider if: Your labor has started. Your water breaks. You have nausea, vomiting, or diarrhea. Get help right away if: You have painful, regular contractions that are 5 minutes apart or less. Labor starts before you are [redacted] weeks along in your pregnancy. You have a fever. You have bright red blood coming from your vagina. You do not feel your baby moving. You have a severe headache with or without vision problems. You have  chest pain or shortness of breath. These symptoms may represent a serious problem that is an emergency. Do not wait to see if the symptoms will go away. Get medical help right away. Call your local emergency services (911 in the [place] Do not drive yourself to the hospital. Summary Labor is your body's natural process of moving your baby and the placenta out of your uterus. The process of labor usually starts when your baby is full-term, between 30 and 40 weeks of pregnancy. When labor starts, or if your water breaks, call your health care provider or nurse care line. Based on your situation, they will determine when you should go in for an exam. This information is not intended to replace advice given to you by your health care provider. Make sure you  discuss any questions you have with your health care provider. Document Revised: 07/03/2020 Document Reviewed: 07/03/2020 Elsevier Patient Education  2024 [employer].

## 2024-01-22 NOTE — Assessment & Plan Note (Signed)
 RNST today, preliminary u/s report with normal dopplers. Follow up as scheduled with MFM for growth, dopplers Monday 11/24. Timing of delivery will be determined by this ultrasound, Adamari hopes to make it past Thanksgiving.

## 2024-01-25 ENCOUNTER — Ambulatory Visit: Attending: Maternal & Fetal Medicine

## 2024-01-25 ENCOUNTER — Ambulatory Visit: Admitting: Obstetrics

## 2024-01-25 VITALS — BP 113/72

## 2024-01-25 DIAGNOSIS — O0993 Supervision of high risk pregnancy, unspecified, third trimester: Secondary | ICD-10-CM | POA: Insufficient documentation

## 2024-01-25 DIAGNOSIS — O09523 Supervision of elderly multigravida, third trimester: Secondary | ICD-10-CM | POA: Diagnosis present

## 2024-01-25 DIAGNOSIS — O99213 Obesity complicating pregnancy, third trimester: Secondary | ICD-10-CM | POA: Diagnosis present

## 2024-01-25 DIAGNOSIS — O365930 Maternal care for other known or suspected poor fetal growth, third trimester, not applicable or unspecified: Secondary | ICD-10-CM | POA: Insufficient documentation

## 2024-01-25 DIAGNOSIS — D259 Leiomyoma of uterus, unspecified: Secondary | ICD-10-CM | POA: Diagnosis not present

## 2024-01-25 DIAGNOSIS — Z3A36 36 weeks gestation of pregnancy: Secondary | ICD-10-CM | POA: Insufficient documentation

## 2024-01-25 DIAGNOSIS — O3413 Maternal care for benign tumor of corpus uteri, third trimester: Secondary | ICD-10-CM | POA: Diagnosis not present

## 2024-01-25 LAB — CERVICOVAGINAL ANCILLARY ONLY
Chlamydia: NEGATIVE
Comment: NEGATIVE
Comment: NORMAL
Neisseria Gonorrhea: NEGATIVE

## 2024-01-25 NOTE — Progress Notes (Signed)
 MFM Consult Note  Brandi Lucero is currently at [redacted]w[redacted]d. She has been followed due to IUGR.  She denies any problems since her last exam and reports feeling fetal movements throughout the day.    Sonographic findings Single intrauterine pregnancy at 36w 6d. Fetal cardiac activity:  Observed and appears normal. Presentation: Cephalic. Fetal biometry shows the estimated fetal weight of 5 lb 6 oz, 2425 grams (7%), Indicating IUGR.  The fetus grew over 1 pound over the past 4 weeks. Amniotic fluid: Within normal limits.  12.76  MVP: 4.11 cm. Placenta: Posterior. BPP 8/8.   Doppler studies of the umbilical arteries performed today showed an S/D ratio of 2.37.  There were no signs of absent or reversed end-diastolic flow.  Due to IUGR, delivery is recommended at around 38 weeks (next week).  The patient has stated that she would prefer to deliver her baby at the [facility] [place].    I will message the faculty attending and have her scheduled for an induction here in [place] next week.    Fetal kick count instructions were reviewed.  The patient stated that all of her questions were answered.   A total of 20 minutes was spent counseling and coordinating the care for this patient.  Greater than 50% of the time was spent in direct face-to-face contact.

## 2024-01-26 LAB — STREP GP B CULTURE+RFLX: Strep Gp B Culture+Rflx: NEGATIVE

## 2024-02-01 ENCOUNTER — Telehealth: Payer: Self-pay

## 2024-02-01 ENCOUNTER — Other Ambulatory Visit: Payer: Self-pay | Admitting: Obstetrics and Gynecology

## 2024-02-01 ENCOUNTER — Encounter (HOSPITAL_COMMUNITY): Payer: Self-pay | Admitting: *Deleted

## 2024-02-01 ENCOUNTER — Telehealth (HOSPITAL_COMMUNITY): Payer: Self-pay | Admitting: *Deleted

## 2024-02-01 NOTE — Telephone Encounter (Signed)
 TRIAGE VOICEMAIL: Patient states she was seen at [facility] 01/25/24 and was advised she would be induced today or tomorrow. She isn't sure how the process works. Requesting return call.

## 2024-02-01 NOTE — Telephone Encounter (Signed)
 Preadmission screen

## 2024-02-03 ENCOUNTER — Inpatient Hospital Stay (HOSPITAL_COMMUNITY)
Admission: RE | Admit: 2024-02-03 | Discharge: 2024-02-05 | DRG: 806 | Disposition: A | Discharge status: Home / Self Care | Attending: Obstetrics & Gynecology | Admitting: Obstetrics & Gynecology

## 2024-02-03 ENCOUNTER — Inpatient Hospital Stay (HOSPITAL_COMMUNITY): Admitting: Anesthesiology

## 2024-02-03 ENCOUNTER — Other Ambulatory Visit: Payer: Self-pay

## 2024-02-03 ENCOUNTER — Inpatient Hospital Stay (HOSPITAL_COMMUNITY)

## 2024-02-03 DIAGNOSIS — O0993 Supervision of high risk pregnancy, unspecified, third trimester: Principal | ICD-10-CM

## 2024-02-03 DIAGNOSIS — O99344 Other mental disorders complicating childbirth: Secondary | ICD-10-CM | POA: Diagnosis not present

## 2024-02-03 DIAGNOSIS — O26893 Other specified pregnancy related conditions, third trimester: Secondary | ICD-10-CM | POA: Diagnosis present

## 2024-02-03 DIAGNOSIS — Z3A38 38 weeks gestation of pregnancy: Secondary | ICD-10-CM

## 2024-02-03 DIAGNOSIS — O3413 Maternal care for benign tumor of corpus uteri, third trimester: Secondary | ICD-10-CM | POA: Diagnosis present

## 2024-02-03 DIAGNOSIS — O365930 Maternal care for other known or suspected poor fetal growth, third trimester, not applicable or unspecified: Secondary | ICD-10-CM | POA: Diagnosis not present

## 2024-02-03 DIAGNOSIS — Z8249 Family history of ischemic heart disease and other diseases of the circulatory system: Secondary | ICD-10-CM

## 2024-02-03 DIAGNOSIS — Z87891 Personal history of nicotine dependence: Secondary | ICD-10-CM

## 2024-02-03 DIAGNOSIS — Z6791 Unspecified blood type, Rh negative: Secondary | ICD-10-CM

## 2024-02-03 DIAGNOSIS — F3181 Bipolar II disorder: Secondary | ICD-10-CM | POA: Diagnosis present

## 2024-02-03 DIAGNOSIS — D259 Leiomyoma of uterus, unspecified: Secondary | ICD-10-CM | POA: Diagnosis present

## 2024-02-03 LAB — CBC
HCT: 31.3 % — ABNORMAL LOW (ref 36.0–46.0)
Hemoglobin: 10.1 g/dL — ABNORMAL LOW (ref 12.0–15.0)
MCH: 24.9 pg — ABNORMAL LOW (ref 26.0–34.0)
MCHC: 32.3 g/dL (ref 30.0–36.0)
MCV: 77.3 fL — ABNORMAL LOW (ref 80.0–100.0)
Platelets: 330 K/uL (ref 150–400)
RBC: 4.05 MIL/uL (ref 3.87–5.11)
RDW: 16.6 % — ABNORMAL HIGH (ref 11.5–15.5)
WBC: 5.3 K/uL (ref 4.0–10.5)
nRBC: 0 % (ref 0.0–0.2)

## 2024-02-03 LAB — COMPREHENSIVE METABOLIC PANEL WITH GFR
ALT: 27 U/L (ref 0–44)
AST: 31 U/L (ref 15–41)
Albumin: 2.2 g/dL — ABNORMAL LOW (ref 3.5–5.0)
Alkaline Phosphatase: 207 U/L — ABNORMAL HIGH (ref 38–126)
Anion gap: 10 (ref 5–15)
BUN: 5 mg/dL — ABNORMAL LOW (ref 6–20)
CO2: 22 mmol/L (ref 22–32)
Calcium: 8.7 mg/dL — ABNORMAL LOW (ref 8.9–10.3)
Chloride: 102 mmol/L (ref 98–111)
Creatinine, Ser: 0.62 mg/dL (ref 0.44–1.00)
GFR, Estimated: >60 mL/min (ref >60)
Glucose, Bld: 83 mg/dL (ref 70–99)
Potassium: 3.7 mmol/L (ref 3.5–5.1)
Sodium: 134 mmol/L — ABNORMAL LOW (ref 135–145)
Total Bilirubin: 0.5 mg/dL (ref 0.0–1.2)
Total Protein: 6.9 g/dL (ref 6.5–8.1)

## 2024-02-03 LAB — TYPE AND SCREEN
ABO/RH(D): O NEG
Antibody Screen: NEGATIVE

## 2024-02-03 MED ORDER — SOD CITRATE-CITRIC ACID 500-334 MG/5ML PO SOLN: 30.0000 mL | ORAL | Status: DC | PRN

## 2024-02-03 MED ORDER — OXYTOCIN BOLUS FROM INFUSION
333.0000 mL | Freq: Once | INTRAVENOUS | Status: AC
  Administered 2024-02-03: 333 mL via INTRAVENOUS

## 2024-02-03 MED ORDER — FENTANYL-BUPIVACAINE-NACL 0.5-0.125-0.9 MG/250ML-% EP SOLN
12.0000 mL/h | EPIDURAL | Status: DC | PRN
  Administered 2024-02-03 (×2): 12 mL/h via EPIDURAL
  Filled 2024-02-03: qty 250

## 2024-02-03 MED ORDER — PHENYLEPHRINE 80 MCG/ML (10ML) SYRINGE FOR IV PUSH (FOR BLOOD PRESSURE SUPPORT)
80.0000 ug | PREFILLED_SYRINGE | INTRAVENOUS | Status: DC | PRN
  Filled 2024-02-03: qty 10

## 2024-02-03 MED ORDER — TERBUTALINE SULFATE 1 MG/ML IJ SOLN: 0.2500 mg | Freq: Once | INTRAMUSCULAR | Status: DC | PRN

## 2024-02-03 MED ORDER — PHENYLEPHRINE 80 MCG/ML (10ML) SYRINGE FOR IV PUSH (FOR BLOOD PRESSURE SUPPORT): 80.0000 ug | PREFILLED_SYRINGE | INTRAVENOUS | Status: DC | PRN

## 2024-02-03 MED ORDER — FENTANYL CITRATE (PF) 100 MCG/2ML IJ SOLN: 50.0000 ug | INTRAMUSCULAR | Status: DC | PRN

## 2024-02-03 MED ORDER — EPHEDRINE 5 MG/ML INJ: 10.0000 mg | INTRAVENOUS | Status: DC | PRN

## 2024-02-03 MED ORDER — DIPHENHYDRAMINE HCL 50 MG/ML IJ SOLN: 12.5000 mg | INTRAMUSCULAR | Status: DC | PRN

## 2024-02-03 MED ORDER — OXYTOCIN-SODIUM CHLORIDE 30-0.9 UT/500ML-% IV SOLN
2.5000 [IU]/h | INTRAVENOUS | Status: DC
  Filled 2024-02-03: qty 500

## 2024-02-03 MED ORDER — LACTATED RINGERS IV SOLN: 500.0000 mL | INTRAVENOUS | Status: DC | PRN

## 2024-02-03 MED ORDER — ACETAMINOPHEN 325 MG PO TABS: 650.0000 mg | ORAL_TABLET | ORAL | Status: DC | PRN

## 2024-02-03 MED ORDER — LIDOCAINE HCL (PF) 1 % IJ SOLN: 30.0000 mL | INTRAMUSCULAR | Status: DC | PRN

## 2024-02-03 MED ORDER — LIDOCAINE HCL (PF) 1 % IJ SOLN
INTRAMUSCULAR | Status: DC | PRN
  Administered 2024-02-03: 8 mL via EPIDURAL

## 2024-02-03 MED ORDER — LACTATED RINGERS IV SOLN: INTRAVENOUS | Status: DC

## 2024-02-03 MED ORDER — LACTATED RINGERS IV SOLN
500.0000 mL | Freq: Once | INTRAVENOUS | Status: AC
  Administered 2024-02-03: 500 mL via INTRAVENOUS

## 2024-02-03 MED ORDER — LACTATED RINGERS IV SOLN: 500.0000 mL | Freq: Once | INTRAVENOUS | Status: DC

## 2024-02-03 MED ORDER — ONDANSETRON HCL 4 MG/2ML IJ SOLN: 4.0000 mg | Freq: Four times a day (QID) | INTRAMUSCULAR | Status: DC | PRN

## 2024-02-03 MED ORDER — FENTANYL-BUPIVACAINE-NACL 0.5-0.125-0.9 MG/250ML-% EP SOLN: 12.0000 mL/h | EPIDURAL | Status: DC | PRN

## 2024-02-03 MED ORDER — MISOPROSTOL 50MCG HALF TABLET
50.0000 ug | ORAL_TABLET | ORAL | Status: DC | PRN
  Administered 2024-02-03: 50 ug via BUCCAL
  Filled 2024-02-03: qty 1

## 2024-02-03 NOTE — H&P (Signed)
 HPI: Brandi Lucero is a 38 y.o. year old G78P0030 female at [redacted]w[redacted]d weeks gestation Certain, regular LMP C/W 11 week US  who presents to Labor and Delivery for induction of labor for fetal growth      01/25/24: Est. FW:    2425   gm     5 lb 6 oz     7  %  Clinical Staff Provider  Office Location  [facility] Dating  02/16/2024, by Last Menstrual Period/11 wks  Language  English Anatomy US   Nml FGR  Flu Vaccine  11/17/23 Genetic Screen  NIPS:  AFP: Low risk   TDaP vaccine   11/25/23 Hgb A1C or  GTT Early : Third trimester : 1 hr elevated. 3 hr Nml  Covid    LAB RESULTS   Rhogam  --/--/O NEG (05/25 0042)  Blood Type --/--/O NEG (05/25 0042)   RSV  Antibody NEG (05/25 0042)  Feeding Plan Breast& Formula Rubella Immune (04/21 0000)  Contraception  RPR Nonreactive (09/16 1246)   Circumcision N/A HBsAg Negative (04/21 0000)   Pediatrician  Undecided HIV Non-reactive (09/16 0000)  Support Person FOB Varicella    Prenatal Classes declined GBS  Neg    Hep C Negative (04/21 0000)   BTL Consent  Pap Nml 2025  VBAC Consent N/A Hgb Electro      CF      SMA           OB History     Gravida  4   Para      Term      Preterm      AB  3   Living         SAB  2   IAB      Ectopic  1   Multiple      Live Births             Past Medical History:  Diagnosis Date   Anxiety    Bipolar 1 disorder (HCC)    Cheekbone fracture (HCC)    Depression    Diabetes mellitus without complication (HCC)    failed 1 hour GD screening test (gestational DM)   Ectopic pregnancy    Fibroid    IBS (irritable bowel syndrome) 2009   Restless leg syndrome    Past Surgical History:  Procedure Laterality Date   DILATION AND EVACUATION  03/03/2012   ECTOPIC PREGNANCY SURGERY  03/03/2004   LAPAROSCOPY FOR ECTOPIC PREGNANCY  03/03/2004   WISDOM TOOTH EXTRACTION     Family History: family history includes Alzheimer's disease in her maternal grandmother and paternal grandmother;  Cancer in her father and paternal grandfather; Colon cancer in her father; Emphysema in her maternal grandfather; Heart disease in her father; High blood pressure in her mother; Hypertension in her mother; Prostate cancer in her father and paternal grandfather. Social History:  reports that she has quit smoking. Her smoking use included cigarettes. She has been exposed to tobacco smoke. She has never used smokeless tobacco. She reports that she does not currently use alcohol after a past usage of about 2.0 standard drinks of alcohol per week. She reports that she does not use drugs.     Maternal Diabetes: No Genetic Screening: Declined Maternal Ultrasounds/Referrals: IUGR Fetal Ultrasounds or other Referrals:  Referred to Materal Fetal Medicine  Maternal Substance Abuse:  No Significant Maternal Medications:  None Significant Maternal Lab Results:  Group B Strep negative Number of Prenatal Visits:greater than 3 verified  prenatal visits Maternal Vaccinations:TDap and Flu Other Comments:  None  Review of Systems  Constitutional:  Negative for chills and fever.  Eyes:  Negative for visual disturbance.  Gastrointestinal:  Negative for abdominal pain, nausea and vomiting.  Genitourinary:  Negative for vaginal bleeding and vaginal discharge.  Neurological:  Negative for headaches.   Maternal Medical History:  Reason for admission: Nausea. Induction of labor for FGR  Contractions: Frequency: rare.   Perceived severity is mild.   Fetal activity: Perceived fetal activity is normal.   Prenatal complications: IUGR.   No PIH.   Prenatal Complications - Diabetes: none.     Blood pressure 106/83, pulse 91, resp. rate 17, height 5' (1.524 m), weight 71 kg, last menstrual period 05/12/2023. Maternal Exam:  Uterine Assessment: Contraction strength is mild.  Contraction frequency is rare.  Abdomen: Patient reports no abdominal tenderness. Estimated fetal weight is 6 lb.   Fetal presentation:  vertex Introitus: Normal vulva. Vagina is negative for discharge.  Pelvis: adequate for delivery.   Cervix: Cervix evaluated by digital exam.     Fetal Exam Fetal Monitor Review: Baseline rate: 135.  Variability: moderate (6-25 bpm).   Pattern: accelerations present and no decelerations.   Fetal State Assessment: Category I - tracings are normal.   Physical Exam Constitutional:      Appearance: Normal appearance. She is well-developed.  HENT:     Head: Normocephalic.  Eyes:     Conjunctiva/sclera: Conjunctivae normal.  Cardiovascular:     Rate and Rhythm: Normal rate and regular rhythm.     Heart sounds: Normal heart sounds.  Pulmonary:     Effort: Pulmonary effort is normal. No respiratory distress.     Breath sounds: Normal breath sounds.  Abdominal:     Palpations: Abdomen is soft.     Tenderness: There is no abdominal tenderness.  Genitourinary:    General: Normal vulva.     Vagina: No vaginal discharge or bleeding.  Musculoskeletal:        General: Normal range of motion.     Cervical back: Normal range of motion and neck supple.     Right lower leg: No edema.     Left lower leg: No edema.  Skin:    General: Skin is warm and dry.  Neurological:     Mental Status: She is alert and oriented to person, place, and time.  Psychiatric:        Mood and Affect: Mood normal.     Prenatal labs: ABO, Rh: --/--/O NEG (05/25 0042) Antibody: NEG (05/25 0042) Rubella: Immune (04/21 0000) RPR: Nonreactive (09/16 1246)  HBsAg: Negative (04/21 0000)  HIV: Non-reactive (09/16 0000)  GBS: Negative/-- (11/21 9047)   Assessment: 1. Labor: IOL for FGR 2. Fetal Wellbeing: Category I  3. Pain Control: Undecided 4. GBS: Neg 5. 38.1 week IUP 6. FGR 7 % 7. Large fibroids  Plan:  1. Admit to BS per consult with MD 2. Routine L&D orders 3. Analgesia/anesthesia PRN  4. Cytotec then Foley, AROM pitocin PRN  Sway Guttierrez 02/03/2024, 12:45 PM

## 2024-02-03 NOTE — Anesthesia Preprocedure Evaluation (Signed)
 Anesthesia Evaluation  Patient identified by MRN, date of birth, ID band Patient awake    Reviewed: Allergy & Precautions, H&P , NPO status , Patient's Chart, lab work & pertinent test results, reviewed documented beta blocker date and time   Airway Mallampati: II  TM Distance: >3 FB Neck ROM: full    Dental no notable dental hx.    Pulmonary neg pulmonary ROS, former smoker   Pulmonary exam normal breath sounds clear to auscultation       Cardiovascular negative cardio ROS Normal cardiovascular exam Rhythm:regular Rate:Normal     Neuro/Psych negative neurological ROS  negative psych ROS   GI/Hepatic negative GI ROS, Neg liver ROS,,,  Endo/Other  negative endocrine ROSdiabetes    Renal/GU negative Renal ROS  negative genitourinary   Musculoskeletal   Abdominal   Peds  Hematology negative hematology ROS (+)   Anesthesia Other Findings   Reproductive/Obstetrics (+) Pregnancy                              Anesthesia Physical Anesthesia Plan  ASA: 2  Anesthesia Plan: Epidural   Post-op Pain Management: Minimal or no pain anticipated   Induction: Intravenous  PONV Risk Score and Plan: 2 and Treatment may vary due to age or medical condition  Airway Management Planned: Natural Airway  Additional Equipment: Fetal Monitoring  Intra-op Plan:   Post-operative Plan:   Informed Consent: I have reviewed the patients History and Physical, chart, labs and discussed the procedure including the risks, benefits and alternatives for the proposed anesthesia with the patient or authorized representative who has indicated his/her understanding and acceptance.       Plan Discussed with: Anesthesiologist and CRNA  Anesthesia Plan Comments:         Anesthesia Quick Evaluation

## 2024-02-03 NOTE — Progress Notes (Signed)
 Labor Progress Note Brandi Lucero is a 38 y.o. G4P0030 at [redacted]w[redacted]d presented for IO< for FGR  S:  Beginning to feel contractions  O:  BP 122/83   Pulse 82   Resp 17   Ht 5' (1.524 m)   Wt 71 kg   LMP 05/12/2023 (Approximate)   BMI 30.56 kg/m   EFM: baseline 135 bpm/ moderate variability/ 15x15 accels/ no decels  Toco/IUPC: 2-4 mild SVE: Dilation: 4 Effacement (%): 80 Station: -2 Presentation: Vertex Exam by:: Muhannad Bignell SNM   A/P: 38 y.o. G4P0030 [redacted]w[redacted]d  1. Labor: Planned to place foley bulb but during exam cervix changed from 2 to 4. Discussed AROM with patient. Patient agreeable. Clear fluid returned. 2. FWB: Cat 1 3. Pain: pain options discussed. Undecided at this time. 4. GBS - 5. FGR 7% 6. Large fibroids   Anticipate SVB.  Conard VEAR Me, Student-MidWife 6:36 PM

## 2024-02-03 NOTE — Anesthesia Procedure Notes (Signed)
 Epidural Patient location during procedure: OB Start time: 02/03/2024 7:37 PM End time: 02/03/2024 7:40 PM  Staffing Anesthesiologist: Mallory Manus, MD  Preanesthetic Checklist Completed: patient identified, IV checked, site marked, risks and benefits discussed, surgical consent, monitors and equipment checked, pre-op evaluation and timeout performed  Epidural Patient position: sitting Prep: DuraPrep and site prepped and draped Patient monitoring: continuous pulse ox and blood pressure Approach: midline Location: L3-L4 Injection technique: LOR air  Needle:  Needle type: Tuohy  Needle gauge: 17 G Needle length: 9 cm and 9 Needle insertion depth: 5 cm Catheter type: closed end flexible Catheter size: 19 Gauge Catheter at skin depth: 10 cm Test dose: negative  Assessment Events: blood not aspirated, no cerebrospinal fluid, injection not painful, no injection resistance, no paresthesia and negative IV test

## 2024-02-03 NOTE — Discharge Summary (Signed)
 Postpartum Discharge Summary   Patient Name: Brandi Lucero DOB: 1985/05/15 MRN: [id number]  Date of admission: 02/03/2024 Delivery date:02/03/2024 Delivering provider: LORELI IHA D Date of discharge: 02/05/2024  Admitting diagnosis: Indication for care in labor or delivery [O75.9] Intrauterine pregnancy: [redacted]w[redacted]d     Secondary diagnosis:  Principal Problem:   Indication for care in labor or delivery Active Problems:   Bipolar 2 disorder (HCC)   Intrauterine growth restriction (IUGR) affecting care of mother, third trimester, single gestation   Rh negative state in antepartum period  Additional problems: none    Discharge diagnosis: Term Pregnancy Delivered                                              Post partum procedures:rhogam  Augmentation: AROM and Cytotec  Complications: None  Hospital course: Induction of Labor With Vaginal Delivery   39 y.o. yo [id number] at [redacted]w[redacted]d was admitted to the hospital 02/03/2024 for induction of labor.  Indication for induction: FGR 7% @ [redacted]w[redacted]d. She had originally rec'd [facility] w [facility] in [place] w transfer to [facility]; seen by MFM for FGR and preferred IOL @ [facility]. Patient had an uncomplicatedlabor course. Membrane Rupture Time/Date: 6:17 PM,02/03/2024  Delivery Method:Vaginal, Spontaneous Operative Delivery:N/A Episiotomy: None Lacerations:  Vaginal Details of delivery can be found in separate delivery note.  Patient had a postpartum course complicated bynone. Patient is discharged home 02/05/24.  Newborn Data: Birth date:02/03/2024 Birth time:9:50 PM Gender:Female Living status:Living Apgars:9 ,9  Weight:2353 g (5lb 3oz)  Magnesium Sulfate received: No BMZ received: No Rhophylac :Yes  MMR:N/A T-DaP:Given prenatally Flu: Yes RSV Vaccine received: Yes Transfusion:No  Immunizations received: Immunization History  Administered Date(s) Administered    sv, Bivalent, Protein Subunit Rsvpref,pf [place]) 01/22/2024   Influenza, Seasonal,  Injecte, Preservative Fre 11/17/2023   PFIZER(Purple Top)SARS-COV-2 Vaccination 12/25/2019, 01/15/2020   Rho (D) Immune Globulin  05/29/2012   Td (Adult) 01/22/2015   Tdap 03/27/2014, 11/25/2023    Physical exam  Vitals:   02/04/24 1300 02/04/24 1454 02/04/24 2030 02/05/24 0544  BP: 120/71 114/75 112/81 102/68  Pulse: 77 72 87 79  Resp: 18 18 18 19   Temp: 98.2 F (36.8 C) 98.6 F (37 C) 98.7 F (37.1 C) 98.2 F (36.8 C)  TempSrc: Oral Oral Oral Oral  SpO2: 100% 99% 99% 100%  Weight:      Height:       General: alert and cooperative Lochia: appropriate Uterine Fundus: firm Incision: N/A DVT Evaluation: No significant calf/ankle edema.  Labs: Lab Results  Component Value Date   WBC 5.3 02/03/2024   HGB 10.1 (L) 02/03/2024   HCT 31.3 (L) 02/03/2024   MCV 77.3 (L) 02/03/2024   PLT 330 02/03/2024      Latest Ref Rng & Units 02/03/2024   12:45 PM  CMP  Glucose 70 - 99 mg/dL 83   BUN 6 - 20 mg/dL 5   Creatinine 9.55 - 8.99 mg/dL 9.37   Sodium 864 - 854 mmol/L 134   Potassium 3.5 - 5.1 mmol/L 3.7   Chloride 98 - 111 mmol/L 102   CO2 22 - 32 mmol/L 22   Calcium 8.9 - 10.3 mg/dL 8.7   Total Protein 6.5 - 8.1 g/dL 6.9   Total Bilirubin 0.0 - 1.2 mg/dL 0.5   Alkaline Phos 38 - 126 U/L 207   AST 15 -  41 U/L 31   ALT 0 - 44 U/L 27    Edinburgh Score:    02/04/2024    5:13 AM  Edinburgh Postnatal Depression Scale Screening Tool  I have been able to laugh and see the funny side of things. 0  I have looked forward with enjoyment to things. 0  I have blamed myself unnecessarily when things went wrong. 0  I have been anxious or worried for no good reason. 0  I have felt scared or panicky for no good reason. 0  Things have been getting on top of me. 0  I have been so unhappy that I have had difficulty sleeping. 0  I have felt sad or miserable. 0  I have been so unhappy that I have been crying. 0  The thought of harming myself has occurred to me. 0  Edinburgh Postnatal  Depression Scale Total 0   Edinburgh Postnatal Depression Scale Total: 0   After visit meds:  Allergies as of 02/05/2024       Reactions   Latex Itching, Other (See Comments)   Other Reaction(s): rash, itchy   Penicillin G Dermatitis   Other Reaction(s): rash, hives, stomach pain   Vicodin [hydrocodone -acetaminophen ]    Bad headache    Penicillins Rash        Medication List     STOP taking these medications    ALPRAZolam 0.25 MG tablet Commonly known as: XANAX   aspirin EC 81 MG tablet   cyclobenzaprine  5 MG tablet Commonly known as: FLEXERIL    Lexapro 20 MG tablet Generic drug: escitalopram   ondansetron  4 MG disintegrating tablet Commonly known as: ZOFRAN -ODT       TAKE these medications    acetaminophen  500 MG tablet Commonly known as: TYLENOL  Take 2 tablets (1,000 mg total) by mouth every 6 (six) hours as needed for moderate pain (pain score 4-6).   albuterol  108 (90 Base) MCG/ACT inhaler Commonly known as: VENTOLIN  HFA Inhale 1 puff into the lungs every 4 (four) hours as needed.   ibuprofen  600 MG tablet Commonly known as: ADVIL  Take 1 tablet (600 mg total) by mouth every 6 (six) hours as needed for moderate pain (pain score 4-6).   PRENATAL PO Take by mouth.   senna-docusate 8.6-50 MG tablet Commonly known as: Senokot-S Take 2 tablets by mouth at bedtime as needed for mild constipation or moderate constipation.   simethicone  80 MG chewable tablet Commonly known as: MYLICON Chew 80 mg by mouth 4 (four) times daily - after meals and at bedtime.         Discharge home in stable condition Infant Feeding: Bottle and Breast Infant Disposition:home with mother Discharge instruction: per After Visit Summary and Postpartum booklet. Activity: Advance as tolerated. Pelvic rest for 6 weeks.  Diet: routine diet Future Appointments: Future Appointments  Date Time Provider Department Center  03/17/2024  9:15 AM Davis, Devon E, PA-C [facility] None    Follow up Visit:  Follow-up Information     [facility] for [facility] Follow up.   Specialty: Obstetrics and Gynecology Contact information: [address] [place]  [address] [phone]                Loreli Suzen BIRCH, CNM  P [facility] Admin Pool * this pt was seen at [facility]; wants PP visit in [facility] *  Please schedule this patient for Postpartum visit in: 6 weeks with the following provider: Any provider In-Person For  C/S patients schedule nurse incision check in weeks 2 weeks: no High risk pregnancy complicated by: FGR Delivery mode:  SVD Anticipated Birth Control:  other/unsure PP Procedures needed: ? Routine Pap Schedule Integrated BH visit: offer (bipolar)   02/05/2024 Barabara Maier, DO

## 2024-02-04 ENCOUNTER — Encounter (HOSPITAL_COMMUNITY): Payer: Self-pay | Admitting: Obstetrics and Gynecology

## 2024-02-04 LAB — SYPHILIS: RPR W/REFLEX TO RPR TITER AND TREPONEMAL ANTIBODIES, TRADITIONAL SCREENING AND DIAGNOSIS ALGORITHM: RPR Ser Ql: NONREACTIVE

## 2024-02-04 MED ORDER — OXYCODONE HCL 5 MG PO TABS: 5.0000 mg | ORAL_TABLET | ORAL | Status: DC | PRN

## 2024-02-04 MED ORDER — BENZOCAINE-MENTHOL 20-0.5 % EX AERO: 1.0000 | INHALATION_SPRAY | CUTANEOUS | Status: DC | PRN

## 2024-02-04 MED ORDER — SIMETHICONE 80 MG PO CHEW: 80.0000 mg | CHEWABLE_TABLET | ORAL | Status: DC | PRN

## 2024-02-04 MED ORDER — ONDANSETRON HCL 4 MG/2ML IJ SOLN: 4.0000 mg | INTRAMUSCULAR | Status: DC | PRN

## 2024-02-04 MED ORDER — IBUPROFEN 600 MG PO TABS
600.0000 mg | ORAL_TABLET | Freq: Four times a day (QID) | ORAL | Status: DC
  Administered 2024-02-04 – 2024-02-05 (×6): 600 mg via ORAL
  Filled 2024-02-04 (×6): qty 1

## 2024-02-04 MED ORDER — MEASLES, MUMPS & RUBELLA VAC ~~LOC~~ SUSR: 0.5000 mL | Freq: Once | SUBCUTANEOUS | Status: DC

## 2024-02-04 MED ORDER — RHO D IMMUNE GLOBULIN 1500 UNIT/2ML IJ SOSY
300.0000 ug | PREFILLED_SYRINGE | Freq: Once | INTRAMUSCULAR | Status: AC
  Administered 2024-02-04: 300 ug via INTRAVENOUS
  Filled 2024-02-04: qty 2

## 2024-02-04 MED ORDER — TETANUS-DIPHTH-ACELL PERTUSSIS 5-2-15.5 LF-MCG/0.5 IM SUSP: 0.5000 mL | Freq: Once | INTRAMUSCULAR | Status: DC

## 2024-02-04 MED ORDER — PRENATAL MULTIVITAMIN CH
1.0000 | ORAL_TABLET | Freq: Every day | ORAL | Status: DC
  Administered 2024-02-04 – 2024-02-05 (×2): 1 via ORAL
  Filled 2024-02-04 (×2): qty 1

## 2024-02-04 MED ORDER — DIPHENHYDRAMINE HCL 25 MG PO CAPS: 25.0000 mg | ORAL_CAPSULE | Freq: Four times a day (QID) | ORAL | Status: DC | PRN

## 2024-02-04 MED ORDER — COCONUT OIL OIL: 1.0000 | TOPICAL_OIL | Status: DC | PRN

## 2024-02-04 MED ORDER — ONDANSETRON HCL 4 MG PO TABS: 4.0000 mg | ORAL_TABLET | ORAL | Status: DC | PRN

## 2024-02-04 MED ORDER — ACETAMINOPHEN 325 MG PO TABS
650.0000 mg | ORAL_TABLET | ORAL | Status: DC | PRN
  Administered 2024-02-04: 650 mg via ORAL
  Filled 2024-02-04: qty 2

## 2024-02-04 MED ORDER — WITCH HAZEL-GLYCERIN EX PADS: 1.0000 | MEDICATED_PAD | CUTANEOUS | Status: DC | PRN

## 2024-02-04 MED ORDER — SENNOSIDES-DOCUSATE SODIUM 8.6-50 MG PO TABS
2.0000 | ORAL_TABLET | ORAL | Status: DC
  Administered 2024-02-04 – 2024-02-05 (×2): 2 via ORAL
  Filled 2024-02-04 (×2): qty 2

## 2024-02-04 MED ORDER — ZOLPIDEM TARTRATE 5 MG PO TABS: 5.0000 mg | ORAL_TABLET | Freq: Every evening | ORAL | Status: DC | PRN

## 2024-02-04 MED ORDER — DIBUCAINE (PERIANAL) 1 % EX OINT: 1.0000 | TOPICAL_OINTMENT | CUTANEOUS | Status: DC | PRN

## 2024-02-04 NOTE — Social Work (Signed)
CSW acknowledges consult.  CSW attempted to meet with MOB, however MOB had several room guest.  CSW will attempt to visit with MOB at a later time.   Anacaren Kohan, LCSWA Clinical Social Worker [phone] 

## 2024-02-04 NOTE — Lactation Note (Addendum)
 This note was copied from a baby's chart. Lactation Consultation Note  Patient Name: Brandi Lucero Date: 02/04/2024 Age:38 hours Reason for consult: Initial assessment;Primapara;Early term 37-38.6wks;Infant < 6lbs;Maternal endocrine disorder  P1. Mom stated baby will not latch. Lc placed baby in football hold. Got baby latched but would suckle only 3 times. Discussed mom feeding and latching only 10 min at a time when baby does start latching. Discussed mom pumping. Mom stated she has her pump here but LC suggested use hospital pump while here. Mom agreed. Mom shown how to use DEBP & how to disassemble, clean, & reassemble parts. Pump every 3 hrs if possible. Baby needs to feed every 3 hrs if hasn't cued to feed before then. Taught mom pace feeding. Letting baby rest. If she doesn't take as much formula s needed, let her rest and about 20-30 min. Later try to get baby to take more formula before hr is up and bottle expires. Mom states understanding. Gave mom Plan of Care feeding chart and LPI information on how small babies act and what to expect. Keep strict I&O. Newborn feeding habits, STS, reviewed.  Feeding Plan: Mom BF for 10 min if baby will latch Feeding formula according to hours of age amount Pump (when collects colostrum give at next feeding).  Call for questions or concerns.  Maternal Data Has patient been taught Hand Expression?: Yes Does the patient have breastfeeding experience prior to this delivery?: No  Feeding Nipple Type: Slow - flow  LATCH Score Latch: Too sleepy or reluctant, no latch achieved, no sucking elicited.  Audible Swallowing: None  Type of Nipple: Everted at rest and after stimulation  Comfort (Breast/Nipple): Soft / non-tender  Hold (Positioning): Assistance needed to correctly position infant at breast and maintain latch.  LATCH Score: 5   Lactation Tools Discussed/Used Tools: Pump;Flanges Flange Size: 18 Breast pump type:  Double-Electric Breast Pump Pump Education: Setup, frequency, and cleaning;Milk Storage Reason for Pumping: less than 6 lbs Pumping frequency: q 3hr  Interventions Interventions: Breast feeding basics reviewed;Assisted with latch;Skin to skin;Breast massage;Hand express;Breast compression;Adjust position;Support pillows;Position options;Shells;DEBP;Education;Pace feeding;LC Psychologist, educational;Infant Driven Feeding Algorithm education;LPT handout/interventions  Discharge Discharge Education: Outpatient recommendation Pump: DEBP  Consult Status Consult Status: Follow-up Date: 02/05/24 Follow-up type: In-patient    Lealand Elting G 02/04/2024, 9:59 PM

## 2024-02-04 NOTE — Patient Instructions (Signed)

## 2024-02-04 NOTE — Anesthesia Postprocedure Evaluation (Signed)
 Anesthesia Post Note  Patient: Brandi Lucero  Procedure(s) Performed: AN AD HOC LABOR EPIDURAL     Patient location during evaluation: Mother Baby Anesthesia Type: Epidural Level of consciousness: awake and alert and oriented Pain management: satisfactory to patient Vital Signs Assessment: post-procedure vital signs reviewed and stable Respiratory status: respiratory function stable Cardiovascular status: stable Postop Assessment: no headache, no backache, epidural receding, patient able to bend at knees, no signs of nausea or vomiting, adequate PO intake and able to ambulate Anesthetic complications: no   No notable events documented.  Last Vitals:  Vitals:   02/04/24 0100 02/04/24 0515  BP: 121/80 109/75  Pulse: 82 80  Resp: 16 16  Temp: 37.2 C 36.7 C  SpO2: 99%     Last Pain:  Vitals:   02/04/24 0547  TempSrc:   PainSc: 0-No pain   Pain Goal:                   Edita Weyenberg

## 2024-02-04 NOTE — Progress Notes (Signed)
 Post Partum Day #1 Subjective: no complaints, up ad lib, and tolerating PO; wants to breastfeed but needs help getting the baby position for latching  Objective: Blood pressure 109/75, pulse 80, temperature 98 F (36.7 C), resp. rate 16, height 5' (1.524 m), weight 71 kg, last menstrual period 05/12/2023, SpO2 99%, unknown if currently breastfeeding.  Physical Exam:  General: alert, cooperative, and no distress Lochia: appropriate Uterine Fundus: firm DVT Evaluation: No evidence of DVT seen on physical exam.  Recent Labs    02/03/24 1239  HGB 10.1*  HCT 31.3*    Assessment/Plan: Plan for discharge tomorrow and Lactation consult   LOS: 1 day   Suzen JONETTA Gentry, CNM 02/04/2024, 8:12 AM

## 2024-02-05 ENCOUNTER — Other Ambulatory Visit (HOSPITAL_COMMUNITY): Payer: Self-pay

## 2024-02-05 LAB — RH IG WORKUP (INCLUDES ABO/RH)
Fetal Screen: NEGATIVE
Gestational Age(Wks): 38.1
Unit division: 0

## 2024-02-05 MED ORDER — ACETAMINOPHEN 500 MG PO TABS
1000.0000 mg | ORAL_TABLET | Freq: Four times a day (QID) | ORAL | 0 refills | Status: AC | PRN
  Filled 2024-02-05: qty 30, 4d supply, fill #0

## 2024-02-05 MED ORDER — SENNOSIDES-DOCUSATE SODIUM 8.6-50 MG PO TABS
2.0000 | ORAL_TABLET | Freq: Every evening | ORAL | 0 refills | Status: AC | PRN
  Filled 2024-02-05: qty 30, 15d supply, fill #0

## 2024-02-05 MED ORDER — IBUPROFEN 600 MG PO TABS
600.0000 mg | ORAL_TABLET | Freq: Four times a day (QID) | ORAL | 0 refills | Status: AC | PRN
  Filled 2024-02-05: qty 30, 8d supply, fill #0

## 2024-02-05 NOTE — Clinical Social Work Maternal (Signed)
 CLINICAL SOCIAL WORK MATERNAL/CHILD NOTE  Patient Details  Name: Brandi Lucero MRN: [id number] Date of Birth: Jun 20, 1985  Date:  02/05/2024  Clinical Social Worker Initiating Note:  Siyana Erney Date/Time: Initiated:  02/05/24/1422     Child's Name:  Devere Irving   Biological Parents:  Mother, Father Sheeran Ratay 02-04-86, Emilio Irving 08/25/1987)   Need for Interpreter:  None   Reason for Referral:  Behavioral Health Concerns   Address:  [address] [place] [address]    Phone number:  [phone] (home)     Additional phone number:   Household Members/Support Persons (HM/SP):       HM/SP Name Relationship DOB or Age  HM/SP -1        HM/SP -2        HM/SP -3        HM/SP -4        HM/SP -5        HM/SP -6        HM/SP -7        HM/SP -8          Natural Supports (not living in the home):  Parent   Professional Supports: None   Employment: Full-time   Type of Work: [employer]   Education:  Some Materials Engineer arranged:    Surveyor, Quantity Resources:  [employer], Media Planner    Other Resources:  Sales Executive     Cultural/Religious Considerations Which May Impact Care:    Strengths:  Ability to meet basic needs  , Home prepared for child  , Pediatrician chosen   Psychotropic Medications:         Pediatrician:    [employer] area  Pediatrician List:   [facility]  [place]    [place]    [place] [facility]      Pediatrician Fax Number:    Risk Factors/Current Problems:  None   Cognitive State:  Able to Concentrate  , Alert     Mood/Affect:  Calm  , Comfortable     CSW Assessment: CSW received a consult for MOB hx of Bipolar. CSW met with MOB to complete assessment and offer support, CSW entered the room and observed MOB resting in bed, FOB at bedside and guest int e recliner holding the infant. CSW introduced self, and requested to speak to MOB in private, MOB declined and  stated her guest could remain during the assessment. CSW inquire about how MOB was feeling, MOB reported good. CSW confirmed MOB's demographic information, MOB verified the information on file was correct. CSW inquired about MOB MH hx, MOB reported she was diagnosed with Bipolar as a teen and reported during that time she had come situation stressors. MOB denied symptoms of Bipolar in recent years. CSW reported a stable mood throughout pregnancy and no MH concerns. CSW  CSW provided education regarding the baby blues period vs. perinatal mood disorders, discussed treatment and gave resources for mental health follow up if concerns arise.  CSW recommends self-evaluation during the postpartum time period using the New Mom Checklist from Postpartum Progress and encouraged MOB to contact a medical professional if symptoms are noted at any time.  MOB identified FOB, her mom and FOB's mom as her supports.   CSW provided review of Sudden Infant Death Syndrome (SIDS) precautions.  MOB reported she has all essential items for the infant including a bassinet, crib and car seat. CSW identifies no  further need for intervention and no barriers to discharge at this time.  CSW Plan/Description:  No Further Intervention Required/No Barriers to Discharge, Sudden Infant Death Syndrome (SIDS) Education, Perinatal Mood and Anxiety Disorder (PMADs) Education, Other Information/Referral to [employer], LCSW 02/05/2024, 2:43 PM

## 2024-02-11 ENCOUNTER — Telehealth (HOSPITAL_COMMUNITY): Payer: Self-pay | Admitting: *Deleted

## 2024-02-11 NOTE — Telephone Encounter (Signed)
 02/11/2024  Name: Brandi Lucero MRN: [id number] DOB: [birth year]  Reason for Call:  Transition of Care Hospital Discharge Call  Contact Status: Patient Contact Status: Complete  Language assistant needed: Interpreter Mode: Interpreter Not Needed        Follow-Up Questions: Do You Have Any Concerns About Your Health As You Heal From Delivery?: No Do You Have Any Concerns About Your Infants Health?: Yes What Concerns Do You Have About Your Baby?: Baby has been spitting up occasionally after feedings and sometimes she pushes the bottle out of her mouth. Patient reports baby takes 1.5-2oz every 2-3 hours. Baby gets a combination of expressed breast milk and formula. Baby also went 24 hours without stooling and then in the last 12 hours has had two stool balls, different from the seedy soft stools she had before. Advised that sometimes babies spit up. Encouraged good burping in middle of feeding and after feeding as well as holding baby upright (head higher than abdomen) for 20-30 minutes after feedings to see if this helps. Also advised that she may need to try different bottles and nipples to see which ones baby best feeds with. Next pediatrician appt on Dec 17. Encouraged patient to call pediatrician tomorrow morning if she is still concerned about baby's stooling pattern. Patient also notes a knot over baby's umbilicus and wonders if this is a hernia. She said the area is soft and easily compressible. She also notes dimples on the baby's knees. Encouraged patient to ask pediatrician to look at umbilicus and knees at next visit.  Edinburgh Postnatal Depression Scale:  In the Past 7 Days: I have been able to laugh and see the funny side of things.: As much as I always could I have looked forward with enjoyment to things.: As much as I ever did I have blamed myself unnecessarily when things went wrong.: Yes, some of the time I have been anxious or worried for no good reason.: Yes, sometimes I  have felt scared or panicky for no good reason.: Yes, sometimes Things have been getting on top of me.: No, most of the time I have coped quite well I have been so unhappy that I have had difficulty sleeping.: Not at all I have felt sad or miserable.: Not very often I have been so unhappy that I have been crying.: Only occasionally The thought of harming myself has occurred to me.: Never Edinburgh Postnatal Depression Scale Total: 9  PHQ2-9 Depression Scale:     Discharge Follow-up: Edinburgh score requires follow up?: No Patient was advised of the following resources:: Support Group, Breastfeeding Support Group  Post-discharge interventions: Reviewed Newborn Safe Sleep Practices  Mliss Sieve, RN 02/11/2024 14:38

## 2024-02-16 ENCOUNTER — Encounter: Payer: Self-pay | Admitting: Lactation Services

## 2024-03-17 ENCOUNTER — Ambulatory Visit: Admitting: Physician Assistant

## 2024-03-17 DIAGNOSIS — F419 Anxiety disorder, unspecified: Secondary | ICD-10-CM

## 2024-03-17 MED ORDER — NORETHIN ACE-ETH ESTRAD-FE 1-20 MG-MCG(24) PO TABS: 1.0000 | ORAL_TABLET | Freq: Every day | ORAL | 3 refills | Status: AC

## 2024-03-17 MED ORDER — HYDROXYZINE HCL 25 MG PO TABS: 25.0000 mg | ORAL_TABLET | Freq: Two times a day (BID) | ORAL | 0 refills | Status: AC

## 2024-03-17 NOTE — Progress Notes (Signed)
 "   Post Partum Visit Note  Brandi Lucero is a 39 y.o. [phone] female who presents for a postpartum visit. She is 6 weeks postpartum following a normal spontaneous vaginal delivery.  I have fully reviewed the prenatal and intrapartum course. The delivery was at 38 gestational weeks.  Anesthesia: epidural. Postpartum course has been good. Baby is doing well yes. Baby is feeding by bottle - Similac Neosure. Bleeding no bleeding. Bowel function is normal. Bladder function is normal. Patient is not sexually active. Contraception method is none. Postpartum depression screening: negative.   The pregnancy intention screening data noted above was reviewed. Potential methods of contraception were discussed. The patient elected to proceed with No data recorded.   Edinburgh Postnatal Depression Scale - 03/17/24 0918       Edinburgh Postnatal Depression Scale:  In the Past 7 Days   I have been able to laugh and see the funny side of things. 0    I have looked forward with enjoyment to things. 0    I have blamed myself unnecessarily when things went wrong. 0    I have been anxious or worried for no good reason. 2    I have felt scared or panicky for no good reason. 0    Things have been getting on top of me. 0    I have been so unhappy that I have had difficulty sleeping. 0    I have felt sad or miserable. 0    I have been so unhappy that I have been crying. 0    The thought of harming myself has occurred to me. 0    Edinburgh Postnatal Depression Scale Total 2          Health Maintenance Due  Topic Date Due   Hepatitis B Vaccines 19-59 Average Risk (1 of 3 - 19+ 3-dose series) Never done   COVID-19 Vaccine (3 - 2025-26 season) 11/02/2023    The following portions of the patient's history were reviewed and updated as appropriate: allergies, current medications, past family history, past medical history, past social history, past surgical history, and problem list.  Review of  Systems Pertinent items noted in HPI and remainder of comprehensive ROS otherwise negative.  Objective:  BP 112/81   Pulse 88   Ht 5' (1.524 m)   Wt 144 lb (65.3 kg)   LMP 05/12/2023 (Approximate)   Breastfeeding No   BMI 28.12 kg/m    General:  alert, cooperative, and appears stated age   Breasts:  not indicated  Lungs: clear to auscultation bilaterally  Heart:  regular rate and rhythm, S1, S2 normal, no murmur, click, rub or gallop  Abdomen: soft, non-tender; bowel sounds normal; no masses,  no organomegaly   Wound Not indicated  GU exam:  not indicated       Assessment:  1. Postpartum care and examination (Primary) 2. Anxiety Patient doing well, concerned about levels of anxiety when she separates from baby to return to work.  Patient requested to start lexapro and xanax again for anxiety. With bipolar I and II diagnoses in her chart, I expressed that if she has BPI, SSRI alone could flip her into mania. I advised that psychiatry or PCP should start long-term meds, but will send atarax  to tie her over until she can get in for appointment.    - Norethindrone Acetate-Ethinyl Estrad-FE (LOESTRIN 24 FE) 1-20 MG-MCG(24) tablet; Take 1 tablet by mouth daily.  Dispense: 90 tablet; Refill: 3 - hydrOXYzine  (ATARAX )  25 MG tablet; Take 1 tablet (25 mg total) by mouth 2 (two) times daily.  Dispense: 180 tablet; Refill: 0   Plan:   Essential components of care per ACOG recommendations:  1.  Mood and well being: Patient with negative depression screening today. Reviewed local resources for support.  - Patient tobacco use? No.   - hx of drug use? No.    2. Infant care and feeding:  -Patient currently breastmilk feeding? No.  -Social determinants of health (SDOH) reviewed in EPIC. Stress and isolation identified.   3. Sexuality, contraception and birth spacing - Patient does not want a pregnancy in the next year.   - Reviewed reproductive life planning. Reviewed contraceptive methods  based on pt preferences and effectiveness.  Patient desired Oral Contraceptive today.   - Discussed birth spacing of 18 months  4. Sleep and fatigue -Encouraged family/partner/community support of 4 hrs of uninterrupted sleep to help with mood and fatigue  5. Physical Recovery  - Discussed patients delivery and complications. She describes her labor as good. - Patient had a Vaginal, no problems at delivery. Patient had vaginal laceration. Perineal healing reviewed. Patient expressed understanding - Patient has urinary incontinence? No. - Patient is safe to resume physical and sexual activity when she is ready.   6.  Health Maintenance - HM due items addressed Yes - Last pap smear 07/06/23 NILM, HPV negative. Pap smear not indicated at today's visit.  -Breast Cancer screening indicated? No.   7. Chronic Disease/Pregnancy Condition follow up: anxiety - PCP follow up  Jorene FORBES Moats, PA-C Center for [employer], [facility]  "

## 2024-03-17 NOTE — Progress Notes (Signed)
 Pt presents for pp. Pt wants to talk about bc. No other questions or concerns at this time.

## 2024-04-11 ENCOUNTER — Ambulatory Visit: Payer: Self-pay | Admitting: Obstetrics and Gynecology
